# Patient Record
Sex: Female | Born: 1955 | Hispanic: No | Smoking: Former smoker
Health system: Southern US, Community
[De-identification: ages and names within clinical notes are randomized; demographics above are authoritative.]

## PROBLEM LIST (undated history)

## (undated) DIAGNOSIS — M810 Age-related osteoporosis without current pathological fracture: Secondary | ICD-10-CM

## (undated) DIAGNOSIS — R079 Chest pain, unspecified: Secondary | ICD-10-CM

## (undated) DIAGNOSIS — E538 Deficiency of other specified B group vitamins: Secondary | ICD-10-CM

## (undated) DIAGNOSIS — F411 Generalized anxiety disorder: Secondary | ICD-10-CM

## (undated) DIAGNOSIS — J309 Allergic rhinitis, unspecified: Secondary | ICD-10-CM

## (undated) DIAGNOSIS — J449 Chronic obstructive pulmonary disease, unspecified: Secondary | ICD-10-CM

## (undated) DIAGNOSIS — I498 Other specified cardiac arrhythmias: Secondary | ICD-10-CM

## (undated) DIAGNOSIS — D649 Anemia, unspecified: Secondary | ICD-10-CM

## (undated) DIAGNOSIS — R5381 Other malaise: Secondary | ICD-10-CM

## (undated) DIAGNOSIS — R5383 Other fatigue: Secondary | ICD-10-CM

## (undated) DIAGNOSIS — G8190 Hemiplegia, unspecified affecting unspecified side: Secondary | ICD-10-CM

## (undated) DIAGNOSIS — M949 Disorder of cartilage, unspecified: Secondary | ICD-10-CM

## (undated) DIAGNOSIS — M899 Disorder of bone, unspecified: Secondary | ICD-10-CM

## (undated) DIAGNOSIS — Z8679 Personal history of other diseases of the circulatory system: Secondary | ICD-10-CM

## (undated) DIAGNOSIS — G4700 Insomnia, unspecified: Secondary | ICD-10-CM

## (undated) DIAGNOSIS — I10 Essential (primary) hypertension: Secondary | ICD-10-CM

## (undated) DIAGNOSIS — E785 Hyperlipidemia, unspecified: Secondary | ICD-10-CM

## (undated) DIAGNOSIS — I080 Rheumatic disorders of both mitral and aortic valves: Secondary | ICD-10-CM

## (undated) DIAGNOSIS — M549 Dorsalgia, unspecified: Secondary | ICD-10-CM

## (undated) DIAGNOSIS — F329 Major depressive disorder, single episode, unspecified: Secondary | ICD-10-CM

## (undated) DIAGNOSIS — I739 Peripheral vascular disease, unspecified: Secondary | ICD-10-CM

## (undated) DIAGNOSIS — E559 Vitamin D deficiency, unspecified: Secondary | ICD-10-CM

## (undated) DIAGNOSIS — H547 Unspecified visual loss: Secondary | ICD-10-CM

## (undated) DIAGNOSIS — R51 Headache: Secondary | ICD-10-CM

## (undated) DIAGNOSIS — K219 Gastro-esophageal reflux disease without esophagitis: Secondary | ICD-10-CM

## (undated) HISTORY — DX: Disorder of bone, unspecified: M89.9

## (undated) HISTORY — DX: Peripheral vascular disease, unspecified: I73.9

## (undated) HISTORY — DX: Allergic rhinitis, unspecified: J30.9

## (undated) HISTORY — PX: CHOLECYSTECTOMY: SHX55

## (undated) HISTORY — DX: Other specified cardiac arrhythmias: I49.8

## (undated) HISTORY — DX: Dorsalgia, unspecified: M54.9

## (undated) HISTORY — DX: Chronic obstructive pulmonary disease, unspecified: J44.9

## (undated) HISTORY — DX: Other malaise: R53.81

## (undated) HISTORY — DX: Headache: R51

## (undated) HISTORY — DX: Disorder of cartilage, unspecified: M94.9

## (undated) HISTORY — DX: Age-related osteoporosis without current pathological fracture: M81.0

## (undated) HISTORY — DX: Generalized anxiety disorder: F41.1

## (undated) HISTORY — DX: Deficiency of other specified B group vitamins: E53.8

## (undated) HISTORY — DX: Chest pain, unspecified: R07.9

## (undated) HISTORY — DX: Unspecified visual loss: H54.7

## (undated) HISTORY — DX: Major depressive disorder, single episode, unspecified: F32.9

## (undated) HISTORY — DX: Essential (primary) hypertension: I10

## (undated) HISTORY — DX: Vitamin D deficiency, unspecified: E55.9

## (undated) HISTORY — DX: Gastro-esophageal reflux disease without esophagitis: K21.9

## (undated) HISTORY — DX: Personal history of other diseases of the circulatory system: Z86.79

## (undated) HISTORY — DX: Other fatigue: R53.83

## (undated) HISTORY — DX: Insomnia, unspecified: G47.00

## (undated) HISTORY — DX: Rheumatic disorders of both mitral and aortic valves: I08.0

## (undated) HISTORY — PX: TUBAL LIGATION: SHX77

## (undated) HISTORY — DX: Anemia, unspecified: D64.9

## (undated) HISTORY — DX: Hyperlipidemia, unspecified: E78.5

## (undated) HISTORY — DX: Hemiplegia, unspecified affecting unspecified side: G81.90

---

## 2002-01-26 ENCOUNTER — Inpatient Hospital Stay (HOSPITAL_COMMUNITY): Admission: EM | Admit: 2002-01-26 | Discharge: 2002-02-01 | Payer: Self-pay | Admitting: Emergency Medicine

## 2002-01-26 ENCOUNTER — Encounter: Payer: Self-pay | Admitting: Emergency Medicine

## 2002-01-27 ENCOUNTER — Encounter: Payer: Self-pay | Admitting: General Surgery

## 2002-01-29 ENCOUNTER — Encounter: Payer: Self-pay | Admitting: General Surgery

## 2002-01-31 ENCOUNTER — Encounter: Payer: Self-pay | Admitting: General Surgery

## 2002-03-01 ENCOUNTER — Ambulatory Visit (HOSPITAL_COMMUNITY): Admission: RE | Admit: 2002-03-01 | Discharge: 2002-03-01 | Payer: Self-pay | Admitting: Cardiology

## 2003-05-01 ENCOUNTER — Inpatient Hospital Stay (HOSPITAL_COMMUNITY): Admission: EM | Admit: 2003-05-01 | Discharge: 2003-05-07 | Payer: Self-pay | Admitting: Emergency Medicine

## 2003-05-01 ENCOUNTER — Encounter: Payer: Self-pay | Admitting: Emergency Medicine

## 2003-05-01 ENCOUNTER — Encounter: Payer: Self-pay | Admitting: Neurology

## 2003-05-01 ENCOUNTER — Encounter: Payer: Self-pay | Admitting: Cardiology

## 2003-05-07 ENCOUNTER — Inpatient Hospital Stay (HOSPITAL_COMMUNITY)
Admission: RE | Admit: 2003-05-07 | Discharge: 2003-05-22 | Payer: Self-pay | Admitting: Physical Medicine & Rehabilitation

## 2003-06-30 ENCOUNTER — Encounter
Admission: RE | Admit: 2003-06-30 | Discharge: 2003-09-28 | Payer: Self-pay | Admitting: Physical Medicine & Rehabilitation

## 2004-03-09 ENCOUNTER — Encounter
Admission: RE | Admit: 2004-03-09 | Discharge: 2004-06-07 | Payer: Self-pay | Admitting: Physical Medicine & Rehabilitation

## 2004-07-18 LAB — HM COLONOSCOPY: HM Colonoscopy: NORMAL

## 2004-08-26 ENCOUNTER — Ambulatory Visit: Payer: Self-pay | Admitting: Internal Medicine

## 2005-08-05 ENCOUNTER — Ambulatory Visit: Payer: Self-pay | Admitting: Internal Medicine

## 2005-09-15 ENCOUNTER — Ambulatory Visit: Payer: Self-pay | Admitting: Internal Medicine

## 2006-06-29 ENCOUNTER — Ambulatory Visit: Payer: Self-pay | Admitting: Internal Medicine

## 2006-08-15 ENCOUNTER — Ambulatory Visit: Payer: Self-pay | Admitting: Internal Medicine

## 2006-10-12 ENCOUNTER — Ambulatory Visit: Payer: Self-pay | Admitting: Internal Medicine

## 2006-10-12 LAB — CONVERTED CEMR LAB: Calcium, Total (PTH): 9.4 mg/dL (ref 8.4–10.5)

## 2007-08-03 ENCOUNTER — Telehealth: Payer: Self-pay | Admitting: Internal Medicine

## 2007-09-28 ENCOUNTER — Telehealth: Payer: Self-pay | Admitting: Internal Medicine

## 2007-10-16 ENCOUNTER — Telehealth: Payer: Self-pay | Admitting: Internal Medicine

## 2007-10-25 ENCOUNTER — Ambulatory Visit: Payer: Self-pay | Admitting: Internal Medicine

## 2007-10-25 DIAGNOSIS — R519 Headache, unspecified: Secondary | ICD-10-CM | POA: Insufficient documentation

## 2007-10-25 DIAGNOSIS — I739 Peripheral vascular disease, unspecified: Secondary | ICD-10-CM

## 2007-10-25 DIAGNOSIS — J4489 Other specified chronic obstructive pulmonary disease: Secondary | ICD-10-CM

## 2007-10-25 DIAGNOSIS — Z8679 Personal history of other diseases of the circulatory system: Secondary | ICD-10-CM

## 2007-10-25 DIAGNOSIS — F411 Generalized anxiety disorder: Secondary | ICD-10-CM

## 2007-10-25 DIAGNOSIS — J449 Chronic obstructive pulmonary disease, unspecified: Secondary | ICD-10-CM

## 2007-10-25 DIAGNOSIS — R5381 Other malaise: Secondary | ICD-10-CM

## 2007-10-25 DIAGNOSIS — K219 Gastro-esophageal reflux disease without esophagitis: Secondary | ICD-10-CM

## 2007-10-25 DIAGNOSIS — Z8673 Personal history of transient ischemic attack (TIA), and cerebral infarction without residual deficits: Secondary | ICD-10-CM | POA: Insufficient documentation

## 2007-10-25 DIAGNOSIS — I10 Essential (primary) hypertension: Secondary | ICD-10-CM

## 2007-10-25 DIAGNOSIS — G4700 Insomnia, unspecified: Secondary | ICD-10-CM | POA: Insufficient documentation

## 2007-10-25 DIAGNOSIS — F3289 Other specified depressive episodes: Secondary | ICD-10-CM

## 2007-10-25 DIAGNOSIS — J309 Allergic rhinitis, unspecified: Secondary | ICD-10-CM

## 2007-10-25 DIAGNOSIS — R51 Headache: Secondary | ICD-10-CM

## 2007-10-25 DIAGNOSIS — E785 Hyperlipidemia, unspecified: Secondary | ICD-10-CM | POA: Insufficient documentation

## 2007-10-25 DIAGNOSIS — M810 Age-related osteoporosis without current pathological fracture: Secondary | ICD-10-CM

## 2007-10-25 DIAGNOSIS — M549 Dorsalgia, unspecified: Secondary | ICD-10-CM

## 2007-10-25 DIAGNOSIS — R5383 Other fatigue: Secondary | ICD-10-CM

## 2007-10-25 DIAGNOSIS — F329 Major depressive disorder, single episode, unspecified: Secondary | ICD-10-CM

## 2007-10-25 DIAGNOSIS — I080 Rheumatic disorders of both mitral and aortic valves: Secondary | ICD-10-CM

## 2007-10-25 HISTORY — DX: Essential (primary) hypertension: I10

## 2007-10-25 HISTORY — DX: Gastro-esophageal reflux disease without esophagitis: K21.9

## 2007-10-25 HISTORY — DX: Chronic obstructive pulmonary disease, unspecified: J44.9

## 2007-10-25 HISTORY — DX: Hyperlipidemia, unspecified: E78.5

## 2007-10-25 HISTORY — DX: Generalized anxiety disorder: F41.1

## 2007-10-25 HISTORY — DX: Age-related osteoporosis without current pathological fracture: M81.0

## 2007-10-25 HISTORY — DX: Peripheral vascular disease, unspecified: I73.9

## 2007-10-25 HISTORY — DX: Other malaise: R53.81

## 2007-10-25 HISTORY — DX: Rheumatic disorders of both mitral and aortic valves: I08.0

## 2007-10-25 HISTORY — DX: Other specified depressive episodes: F32.89

## 2007-10-25 HISTORY — DX: Personal history of other diseases of the circulatory system: Z86.79

## 2007-10-25 HISTORY — DX: Major depressive disorder, single episode, unspecified: F32.9

## 2007-10-25 HISTORY — DX: Dorsalgia, unspecified: M54.9

## 2007-10-25 HISTORY — DX: Other specified chronic obstructive pulmonary disease: J44.89

## 2007-10-25 HISTORY — DX: Allergic rhinitis, unspecified: J30.9

## 2007-10-25 HISTORY — DX: Headache: R51

## 2007-10-25 HISTORY — DX: Insomnia, unspecified: G47.00

## 2007-10-25 LAB — CONVERTED CEMR LAB
ALT: 13 units/L (ref 0–35)
Basophils Absolute: 0 10*3/uL (ref 0.0–0.1)
Basophils Relative: 0.2 % (ref 0.0–1.0)
CO2: 27 meq/L (ref 19–32)
Calcium: 8.9 mg/dL (ref 8.4–10.5)
Cholesterol: 148 mg/dL (ref 0–200)
Creatinine, Ser: 1 mg/dL (ref 0.4–1.2)
Eosinophils Absolute: 0.1 10*3/uL (ref 0.0–0.7)
GFR calc Af Amer: 74 mL/min
GFR calc non Af Amer: 61 mL/min
HCT: 39.5 % (ref 36.0–46.0)
Hemoglobin: 12.6 g/dL (ref 12.0–15.0)
MCHC: 32 g/dL (ref 30.0–36.0)
MCV: 87.2 fL (ref 78.0–100.0)
Monocytes Absolute: 0.4 10*3/uL (ref 0.1–1.0)
Neutro Abs: 2.5 10*3/uL (ref 1.4–7.7)
RBC: 4.53 M/uL (ref 3.87–5.11)
RDW: 13.7 % (ref 11.5–14.6)
Sodium: 146 meq/L — ABNORMAL HIGH (ref 135–145)
TSH: 0.57 microintl units/mL (ref 0.35–5.50)
Total Bilirubin: 0.5 mg/dL (ref 0.3–1.2)
Triglycerides: 105 mg/dL (ref 0–149)

## 2007-12-19 ENCOUNTER — Inpatient Hospital Stay (HOSPITAL_COMMUNITY): Admission: EM | Admit: 2007-12-19 | Discharge: 2007-12-21 | Payer: Self-pay | Admitting: Emergency Medicine

## 2007-12-19 ENCOUNTER — Ambulatory Visit: Payer: Self-pay | Admitting: Internal Medicine

## 2007-12-19 ENCOUNTER — Telehealth: Payer: Self-pay | Admitting: Internal Medicine

## 2007-12-20 ENCOUNTER — Encounter: Payer: Self-pay | Admitting: Internal Medicine

## 2007-12-20 ENCOUNTER — Ambulatory Visit: Payer: Self-pay | Admitting: Internal Medicine

## 2007-12-31 ENCOUNTER — Ambulatory Visit: Payer: Self-pay | Admitting: Internal Medicine

## 2007-12-31 DIAGNOSIS — R079 Chest pain, unspecified: Secondary | ICD-10-CM | POA: Insufficient documentation

## 2007-12-31 HISTORY — DX: Chest pain, unspecified: R07.9

## 2008-01-01 ENCOUNTER — Encounter (INDEPENDENT_AMBULATORY_CARE_PROVIDER_SITE_OTHER): Payer: Self-pay | Admitting: *Deleted

## 2008-01-10 ENCOUNTER — Ambulatory Visit: Payer: Self-pay

## 2008-01-10 ENCOUNTER — Encounter: Payer: Self-pay | Admitting: Internal Medicine

## 2008-01-31 ENCOUNTER — Telehealth (INDEPENDENT_AMBULATORY_CARE_PROVIDER_SITE_OTHER): Payer: Self-pay | Admitting: *Deleted

## 2008-02-05 ENCOUNTER — Ambulatory Visit: Payer: Self-pay | Admitting: Internal Medicine

## 2008-02-05 ENCOUNTER — Encounter: Payer: Self-pay | Admitting: Internal Medicine

## 2008-07-07 ENCOUNTER — Telehealth (INDEPENDENT_AMBULATORY_CARE_PROVIDER_SITE_OTHER): Payer: Self-pay | Admitting: *Deleted

## 2008-10-02 ENCOUNTER — Telehealth (INDEPENDENT_AMBULATORY_CARE_PROVIDER_SITE_OTHER): Payer: Self-pay | Admitting: *Deleted

## 2008-10-24 ENCOUNTER — Telehealth (INDEPENDENT_AMBULATORY_CARE_PROVIDER_SITE_OTHER): Payer: Self-pay | Admitting: *Deleted

## 2008-10-29 ENCOUNTER — Encounter: Payer: Self-pay | Admitting: Internal Medicine

## 2008-11-05 ENCOUNTER — Telehealth (INDEPENDENT_AMBULATORY_CARE_PROVIDER_SITE_OTHER): Payer: Self-pay | Admitting: *Deleted

## 2008-11-10 ENCOUNTER — Ambulatory Visit: Payer: Self-pay | Admitting: Internal Medicine

## 2008-11-19 LAB — CONVERTED CEMR LAB
AST: 25 units/L (ref 0–37)
Alkaline Phosphatase: 69 units/L (ref 39–117)
Bilirubin, Direct: 0.3 mg/dL (ref 0.0–0.3)
CO2: 29 meq/L (ref 19–32)
Calcium: 9.1 mg/dL (ref 8.4–10.5)
HDL: 56.8 mg/dL (ref >39.00)
Potassium: 4.5 meq/L (ref 3.5–5.1)
Sodium: 140 meq/L (ref 135–145)
Total CHOL/HDL Ratio: 3
Total Protein: 6.9 g/dL (ref 6.0–8.3)
VLDL: 22 mg/dL (ref 0.0–40.0)

## 2008-12-02 ENCOUNTER — Ambulatory Visit: Payer: Self-pay | Admitting: Internal Medicine

## 2008-12-31 ENCOUNTER — Telehealth (INDEPENDENT_AMBULATORY_CARE_PROVIDER_SITE_OTHER): Payer: Self-pay | Admitting: *Deleted

## 2009-02-12 ENCOUNTER — Encounter: Payer: Self-pay | Admitting: Internal Medicine

## 2009-02-13 ENCOUNTER — Telehealth (INDEPENDENT_AMBULATORY_CARE_PROVIDER_SITE_OTHER): Payer: Self-pay | Admitting: *Deleted

## 2009-10-06 ENCOUNTER — Telehealth: Payer: Self-pay | Admitting: Internal Medicine

## 2009-10-16 ENCOUNTER — Telehealth: Payer: Self-pay | Admitting: Internal Medicine

## 2009-10-29 ENCOUNTER — Telehealth: Payer: Self-pay | Admitting: Internal Medicine

## 2009-10-29 ENCOUNTER — Encounter (INDEPENDENT_AMBULATORY_CARE_PROVIDER_SITE_OTHER): Payer: Self-pay | Admitting: *Deleted

## 2009-11-03 ENCOUNTER — Telehealth: Payer: Self-pay | Admitting: Internal Medicine

## 2009-11-19 ENCOUNTER — Ambulatory Visit: Payer: Self-pay | Admitting: Internal Medicine

## 2009-11-19 DIAGNOSIS — M899 Disorder of bone, unspecified: Secondary | ICD-10-CM | POA: Insufficient documentation

## 2009-11-19 DIAGNOSIS — I498 Other specified cardiac arrhythmias: Secondary | ICD-10-CM

## 2009-11-19 DIAGNOSIS — M949 Disorder of cartilage, unspecified: Secondary | ICD-10-CM

## 2009-11-19 HISTORY — DX: Other specified cardiac arrhythmias: I49.8

## 2009-11-19 HISTORY — DX: Disorder of bone, unspecified: M89.9

## 2009-11-19 LAB — CONVERTED CEMR LAB: Vit D, 25-Hydroxy: 26 ng/mL — ABNORMAL LOW (ref 30–89)

## 2009-11-23 ENCOUNTER — Encounter: Payer: Self-pay | Admitting: Internal Medicine

## 2009-11-23 DIAGNOSIS — G8190 Hemiplegia, unspecified affecting unspecified side: Secondary | ICD-10-CM

## 2009-11-23 DIAGNOSIS — I69359 Hemiplegia and hemiparesis following cerebral infarction affecting unspecified side: Secondary | ICD-10-CM | POA: Insufficient documentation

## 2009-11-23 DIAGNOSIS — H547 Unspecified visual loss: Secondary | ICD-10-CM

## 2009-11-23 HISTORY — DX: Unspecified visual loss: H54.7

## 2009-11-23 HISTORY — DX: Hemiplegia, unspecified affecting unspecified side: G81.90

## 2009-11-23 LAB — CONVERTED CEMR LAB
ALT: 10 units/L (ref 0–35)
Albumin: 3.9 g/dL (ref 3.5–5.2)
Alkaline Phosphatase: 102 units/L (ref 39–117)
BUN: 11 mg/dL (ref 6–23)
Basophils Relative: 0.8 % (ref 0.0–3.0)
Bilirubin, Direct: 0 mg/dL (ref 0.0–0.3)
Eosinophils Relative: 1.6 % (ref 0.0–5.0)
Folate: 3.5 ng/mL
GFR calc non Af Amer: 68.92 mL/min (ref >60)
HDL: 63.9 mg/dL (ref >39.00)
Iron: 39 ug/dL — ABNORMAL LOW (ref 42–145)
Lymphocytes Relative: 25 % (ref 12.0–46.0)
Monocytes Relative: 6.9 % (ref 3.0–12.0)
Neutrophils Relative %: 65.7 % (ref 43.0–77.0)
RBC: 4.96 M/uL (ref 3.87–5.11)
Triglycerides: 186 mg/dL — ABNORMAL HIGH (ref 0.0–149.0)
Vitamin B-12: 166 pg/mL — ABNORMAL LOW (ref 211–911)
WBC: 5.3 10*3/uL (ref 4.5–10.5)

## 2009-12-01 ENCOUNTER — Ambulatory Visit: Payer: Self-pay | Admitting: Internal Medicine

## 2009-12-01 DIAGNOSIS — E538 Deficiency of other specified B group vitamins: Secondary | ICD-10-CM

## 2009-12-01 HISTORY — DX: Deficiency of other specified B group vitamins: E53.8

## 2010-01-07 ENCOUNTER — Ambulatory Visit: Payer: Self-pay | Admitting: Internal Medicine

## 2010-06-03 ENCOUNTER — Ambulatory Visit: Payer: Self-pay | Admitting: Internal Medicine

## 2010-06-03 DIAGNOSIS — E559 Vitamin D deficiency, unspecified: Secondary | ICD-10-CM

## 2010-06-03 HISTORY — DX: Vitamin D deficiency, unspecified: E55.9

## 2010-06-14 ENCOUNTER — Telehealth: Payer: Self-pay | Admitting: Internal Medicine

## 2010-06-16 ENCOUNTER — Telehealth: Payer: Self-pay | Admitting: Internal Medicine

## 2010-07-07 ENCOUNTER — Encounter: Payer: Self-pay | Admitting: Internal Medicine

## 2010-08-17 NOTE — Progress Notes (Signed)
  Phone Note Outgoing Call   Call placed by: Robin Call placed to: Patient Summary of Call: Called pt. to inform medication (Plavix) ordered from patient assitance has arrived at our office and ready for pickup. Left msg. for pt. to call back. Initial call taken by: Robin Ewing CMA Duncan Dull),  June 14, 2010 3:27 PM  Follow-up for Phone Call        called pt informed of above information. Follow-up by: Zella Ball Ewing CMA Duncan Dull),  June 14, 2010 4:17 PM

## 2010-08-17 NOTE — Progress Notes (Signed)
Summary: Plavix?  Phone Note Call from Patient Call back at [facility] Phone [phone]   Caller: Patient Summary of Call: pt called stating that she picked up medications from Pt Asst program, but it did not include Plavix. Pt is requesting to know the status of order, please advise. I called Pt Asst and spoke with Rep Malachi Bonds, who advised that Plavix was signed for at our office 03/30 @ 11:12a. I was not able to locate Rx but I faxed letter to company [phone] as advised by Rep requesting a replacement. I also told Pt that she would need to re-apply by June via VM.  Initial call taken by: Margaret Pyle, CMA,  October 29, 2009 11:08 AM

## 2010-08-17 NOTE — Assessment & Plan Note (Signed)
Summary: b12 shot/[place]/ok per robin/cd  Nurse Visit   Allergies: 1)  ! Ace Inhibitors  Medication Administration  Injection # 1:    Medication: Vit B12 1000 mcg    Diagnosis: B12 DEFICIENCY (ICD-266.2)    Route: IM    Site: LUOQ gluteus    Exp Date: 07/19/2011    Lot #: [id number]    Mfr: [employer]    Patient tolerated injection without complications    Given by: Margaret Pyle, CMA (Dec 01, 2009 10:32 AM)  Orders Added: 1)  Vit B12 1000 mcg [J3420] 2)  Admin of Therapeutic Inj  intramuscular or subcutaneous [60454]

## 2010-08-17 NOTE — Miscellaneous (Signed)
Summary: Orders Update  Clinical Lists Changes  Problems: Added new problem of HEMIPARESIS, LEFT (ICD-342.90) Added new problem of UNSPECIFIED VISUAL LOSS (ICD-369.9)

## 2010-08-17 NOTE — Assessment & Plan Note (Signed)
Summary: 6 mos f/u // #/ cd   Vital Signs:  Patient profile:   55 year old female Height:      67 inches Weight:      140 pounds BMI:     22.01 O2 Sat:      97 % on Room air Temp:     97.8 degrees F oral Pulse rate:   63 / minute BP sitting:   110 / 84  (left arm) Cuff size:   regular  Vitals Entered By: Zella Ball Ewing CMA Duncan Dull) (June 03, 2010 9:39 AM)  O2 Flow:  Room air CC: 6 month ROV/RE   CC:  6 month ROV/RE.  History of Present Illness: here to f/u; due for b12 today, and labs reviewed with low vit d;  decided not to take the citalopram and states on her own she has improve - Denies worsening depressive symptoms, suicidal ideation, or panic, or increased anxiety.  Needs med refills.  Incidently today with 3 days mild to mod ST, fever, general weakness and malaise, with prod cough greenish sputum  but Pt denies CP, worsening sob, doe, wheezing, orthopnea, pnd, worsening LE edema, palps, dizziness or syncope  Pt denies new neuro symptoms such as headache, facial or extremity weakness Pt denies polydipsia, polyuria    Overall good compliance with meds, trying to follow low chol diet, wt stable, little excercise however   Problems Prior to Update: 1)  Bronchitis-acute  (ICD-466.0) 2)  Vitamin D Deficiency  (ICD-268.9) 3)  B12 Deficiency  (ICD-266.2) 4)  Unspecified Visual Loss  (ICD-369.9) 5)  Hemiparesis, Left  (ICD-342.90) 6)  Preventive Health Care  (ICD-V70.0) 7)  Bradycardia  (ICD-427.89) 8)  Osteopenia  (ICD-733.90) 9)  Chest Pain  (ICD-786.50) 10)  Anxiety  (ICD-300.00) 11)  Depression  (ICD-311) 12)  Gerd  (ICD-530.81) 13)  Osteoporosis  (ICD-733.00) 14)  Fatigue  (ICD-780.79) 15)  Insomnia-sleep Disorder-unspec  (ICD-780.52) 16)  Back Pain  (ICD-724.5) 17)  Allergic Rhinitis  (ICD-477.9) 18)  Mitral Insufficiency  (ICD-396.3) 19)  Cerebrovascular Accident, Hx of  (ICD-V12.50) 20)  Peripheral Vascular Disease  (ICD-443.9) 21)  Hypertension  (ICD-401.9) 22)   Hyperlipidemia  (ICD-272.4) 23)  Headache  (ICD-784.0) 24)  COPD  (ICD-496)  Medications Prior to Update: 1)  Plavix 75 Mg  Tabs (Clopidogrel Bisulfate) .... Take 1 Tablet By Mouth Once A Day 2)  Norvasc 10 Mg  Tabs (Amlodipine Besylate) .... Take 1 Tablet By Mouth Once A Day 3)  Aldactazide 25-25 Mg  Tabs (Spironolactone-Hctz) .Marland Kitchen.. 1 By Mouth Once Daily 4)  Metoprolol Tartrate 25 Mg  Tabs (Metoprolol Tartrate) .Marland Kitchen.. 1 By Mouth Once Daily 5)  Fosamax 70 Mg  Tabs (Alendronate Sodium) .Marland Kitchen.. 1 By Mouth  Wkly 6)  Clonidine Hcl 0.3 Mg  Tabs (Clonidine Hcl) .Marland Kitchen.. 1 By Mouth Two Times A Day 7)  One A Day Multi Vitamin .Marland Kitchen.. 1 By Mouth Once Daily 8)  Citalopram Hydrobromide 10 Mg Tabs (Citalopram Hydrobromide) .Marland Kitchen.. 1po Once Daily 9)  Vitamin D 1000 Unit Tabs (Cholecalciferol) .Marland Kitchen.. 1 By Mouth Once Daily 10)  Cyanocobalamin 1000 Mcg/ml Soln (Cyanocobalamin) .Marland Kitchen.. 1 Cc Im Q Month 11)  Ferrous Sulfate 325 (65 Fe) Mg Tabs (Ferrous Sulfate) .Marland Kitchen.. 1po Once Daily For 3 Months  Current Medications (verified): 1)  Plavix 75 Mg  Tabs (Clopidogrel Bisulfate) .... Take 1 Tablet By Mouth Once A Day 2)  Norvasc 10 Mg  Tabs (Amlodipine Besylate) .... Take 1 Tablet By Mouth Once A Day 3)  Aldactazide 25-25 Mg  Tabs (Spironolactone-Hctz) .Marland Kitchen.. 1 By Mouth Once Daily 4)  Metoprolol Tartrate 25 Mg  Tabs (Metoprolol Tartrate) .Marland Kitchen.. 1 By Mouth Once Daily 5)  Fosamax 70 Mg  Tabs (Alendronate Sodium) .Marland Kitchen.. 1 By Mouth  Wkly 6)  Clonidine Hcl 0.3 Mg  Tabs (Clonidine Hcl) .Marland Kitchen.. 1 By Mouth Two Times A Day 7)  One A Day Multi Vitamin .Marland Kitchen.. 1 By Mouth Once Daily 8)  Vitamin D 1000 Unit Tabs (Cholecalciferol) .Marland Kitchen.. 1 By Mouth Once Daily 9)  Cyanocobalamin 1000 Mcg/ml Soln (Cyanocobalamin) .Marland Kitchen.. 1 Cc Im Q Month 10)  Ferrous Sulfate 325 (65 Fe) Mg Tabs (Ferrous Sulfate) .Marland Kitchen.. 1po Once Daily For 3 Months 11)  Azithromycin 250 Mg Tabs (Azithromycin) .... 2po Qd For 1 Day, Then 1po Qd For 4days, Then Stop  Allergies (verified): 1)  ! 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 Cholecystectomy Tubal ligation  Review of Systems       all otherwise negative per pt -    Physical Exam  General:  alert and well-developed.   Head:  normocephalic and atraumatic.   Eyes:  vision grossly intact, pupils equal, and pupils round.   Ears:  bilat tm's red , sinus nontender bilat Nose:  nasal dischargemucosal pallor and mucosal edema.   Mouth:  pharyngeal erythema and fair dentition.   Neck:  supple and cervical lymphadenopathy.   Lungs:  normal respiratory effort and normal breath sounds.   Heart:  normal rate and regular rhythm.   Extremities:  no edema, no erythema  Psych:  not anxious appearing and not depressed appearing.     Impression & Recommendations:  Problem # 1:  BRONCHITIS-ACUTE (ICD-466.0)  Her updated medication list for this problem includes:    Azithromycin 250 Mg Tabs (Azithromycin) .Marland Kitchen... 2po qd for 1 day, then 1po qd for 4days, then stop treat as above, f/u any worsening signs or symptoms   Problem # 2:  VITAMIN D DEFICIENCY (ICD-268.9) for vit d supplementation  Problem # 3:  B12 DEFICIENCY (ICD-266.2) due for B12  - for IM inj today Orders: Vit B12 1000 mcg (J3420) Admin of Therapeutic Inj  intramuscular or subcutaneous (78295)  Problem # 4:  DEPRESSION (ICD-311)  The following medications were removed  from the medication list:    Citalopram Hydrobromide 10 Mg Tabs (Citalopram hydrobromide) .Marland Kitchen... 1po once daily improved per pt without med  - stable overall by hx and exam, ok to continue off med for now  Problem # 5:  HYPERTENSION (ICD-401.9)  Her updated medication list for this problem includes:    Norvasc 10 Mg Tabs (Amlodipine besylate) .Marland Kitchen... Take 1 tablet by mouth once a day    Aldactazide 25-25 Mg Tabs (Spironolactone-hctz) .Marland Kitchen... 1 by mouth once daily    Metoprolol Tartrate 25 Mg Tabs (Metoprolol tartrate) .Marland Kitchen... 1 by mouth once daily    Clonidine Hcl 0.3 Mg Tabs (Clonidine hcl) .Marland Kitchen... 1 by mouth two times a day  BP today: 110/84 Prior BP: 110/70 (11/19/2009)  Labs  Reviewed: K+: 3.8 (11/19/2009) Creat: : 1.1 (11/19/2009)   Chol: 190 (11/19/2009)   HDL: 63.90 (11/19/2009)   LDL: 89 (11/19/2009)   TG: 186.0 (11/19/2009) stable overall by hx and exam, ok to continue meds/tx as is   Complete Medication List: 1)  Plavix 75 Mg Tabs (Clopidogrel bisulfate) .... Take 1 tablet by mouth once a day 2)  Norvasc 10 Mg Tabs (Amlodipine besylate) .... Take 1 tablet by mouth once a day 3)  Aldactazide 25-25 Mg Tabs (Spironolactone-hctz) .Marland Kitchen.. 1 by mouth once daily 4)  Metoprolol Tartrate 25 Mg Tabs (Metoprolol tartrate) .Marland Kitchen.. 1 by mouth once daily 5)  Fosamax 70 Mg Tabs (Alendronate sodium) .Marland Kitchen.. 1 by mouth  wkly 6)  Clonidine Hcl 0.3 Mg Tabs (Clonidine hcl) .Marland Kitchen.. 1 by mouth two times a day 7)  One A Day Multi Vitamin  .Marland Kitchen.. 1 by mouth once daily 8)  Vitamin D 1000 Unit Tabs (Cholecalciferol) .Marland Kitchen.. 1 by mouth once daily 9)  Cyanocobalamin 1000 Mcg/ml Soln (Cyanocobalamin) .Marland Kitchen.. 1 cc im q month 10)  Ferrous Sulfate 325 (65 Fe) Mg Tabs (Ferrous sulfate) .Marland Kitchen.. 1po once daily for 3 months 11)  Azithromycin 250 Mg Tabs (Azithromycin) .... 2po qd for 1 day, then 1po qd for 4days, then stop  Patient Instructions: 1)  Please take all new medications as prescribed  - the antibiotic 2)  Continue all previous  medications as before this visit 3)  you had the b12 shot today 4)  Please schedule a follow-up appointment in 6 months. Prescriptions: AZITHROMYCIN 250 MG TABS (AZITHROMYCIN) 2po qd for 1 day, then 1po qd for 4days, then stop  #6 x 1   Entered and Authorized by:   Corwin Levins MD   Signed by:   Corwin Levins MD on 06/03/2010   Method used:   Print then Give to Patient   RxID:   [id number] ALDACTAZIDE 25-25 MG  TABS (SPIRONOLACTONE-HCTZ) 1 by mouth once daily  #90 x 3   Entered and Authorized by:   Corwin Levins MD   Signed by:   Corwin Levins MD on 06/03/2010   Method used:   Print then Give to Patient   RxID:   [id number] NORVASC 10 MG  TABS (AMLODIPINE BESYLATE) Take 1 tablet by mouth once a day  #90 x 3   Entered and Authorized by:   Corwin Levins MD   Signed by:   Corwin Levins MD on 06/03/2010   Method used:   Print then Give to Patient   RxID:   [id number] PLAVIX 75 MG  TABS (CLOPIDOGREL BISULFATE) Take 1 tablet by mouth once a day  #90 x 3   Entered and Authorized by:   Corwin Levins MD   Signed by:   Corwin Levins MD on 06/03/2010   Method used:   Print then Give to Patient   RxID:   [id number]    Medication Administration  Injection # 1:    Medication: Vit B12 1000 mcg    Diagnosis: B12 DEFICIENCY (ICD-266.2)    Route: IM    Site: R deltoid    Exp Date: 02/2012    Lot #: [id number]    Mfr: [employer]    Patient tolerated injection without complications    Given by: Zella Ball Ewing CMA Duncan Dull) (June 03, 2010 9:41 AM)  Orders Added: 1)  Vit B12 1000 mcg [J3420] 2)  Admin of Therapeutic Inj  intramuscular or subcutaneous [[id number]] 3)  Est.  Patient Level IV [40981]

## 2010-08-17 NOTE — Assessment & Plan Note (Signed)
Summary: f/u appt/#/cd   Vital Signs:  Patient profile:   55 year old female Height:      139 inches Weight:      12 pounds BMI:     0.44 O2 Sat:      98 % on Room air Temp:     97.4 degrees F oral Pulse rate:   44 / minute BP sitting:   110 / 70  (left arm) Cuff size:   regular  Vitals Entered ByZella Ball Ewing (Nov 19, 2009 9:35 AM)  O2 Flow:  Room air  Preventive Care Screening  Bone Density:    Date:  11/24/2008    Next Due:  11/2010    Results:  abnormal std dev  Colonoscopy:    Date:  07/18/2004    Next Due:  07/2014    Results:  normal per GI in [place]   CC: followup/RE   CC:  followup/RE.  History of Present Illness: check her BP weekly at minimum;  has had several low BP's such as 83/42 which scare her; but also more often has been  higher in up t o 150/102, usually assoc with more stresss;  Pt denies CP, sob, doe, wheezing, orthopnea, pnd, worsening LE edema, palps, dizziness or syncope  Pt denies new neuro symptoms such as headache, facial or extremity weakness  Has ongoing stress, much of it financial, BP tends to go up with stress  which makes it all worse as she worries about another stroke.  has not been taking the nsaid or PPI and doing well.    Here for wellness Diet: Heart Healthy or DM if diabetic Physical Activities: Sedentary Depression/mood screen: Negative Hearing: Intact bilateral Visual Acuity: diminished de to stroke per pt, gets exam yearly usually but not this yr so far  ADL's: Capable , except limited due to sight and residual left sided weakness, walks with cane; worse part is going up stairs  - has to do one step at a time, hard to balance, suffered right ankle sprain jan 2011 with fall Fall Risk: mild, as above Home Safety: Good Cognitive Impairment:  Gen appearance, affect, speech, memory, attention & motor skills grossly intact End-of-Life Planning: Advance directive - Full code/I agree   Preventive Screening-Counseling &  Management      Drug Use:  no.    Problems Prior to Update: 1)  Bradycardia  (ICD-427.89) 2)  Osteopenia  (ICD-733.90) 3)  Chest Pain  (ICD-786.50) 4)  Anxiety  (ICD-300.00) 5)  Depression  (ICD-311) 6)  Gerd  (ICD-530.81) 7)  Osteoporosis  (ICD-733.00) 8)  Fatigue  (ICD-780.79) 9)  Insomnia-sleep Disorder-unspec  (ICD-780.52) 10)  Back Pain  (ICD-724.5) 11)  Allergic Rhinitis  (ICD-477.9) 12)  Mitral Insufficiency  (ICD-396.3) 13)  Cerebrovascular Accident, Hx of  (ICD-V12.50) 14)  Peripheral Vascular Disease  (ICD-443.9) 15)  Hypertension  (ICD-401.9) 16)  Hyperlipidemia  (ICD-272.4) 17)  Headache  (ICD-784.0) 18)  COPD  (ICD-496)  Medications Prior to Update: 1)  Plavix 75 Mg  Tabs (Clopidogrel Bisulfate) .... Take 1 Tablet By Mouth Once A Day 2)  Norvasc 10 Mg  Tabs (Amlodipine Besylate) .... Take 1 Tablet By Mouth Once A Day 3)  Aldactazide 25-25 Mg  Tabs (Spironolactone-Hctz) .Marland Kitchen.. 1 By Mouth Once Daily 4)  Metoprolol Tartrate 25 Mg  Tabs (Metoprolol Tartrate) .Marland Kitchen.. 1 By Mouth Once Daily 5)  Fosamax 70 Mg  Tabs (Alendronate Sodium) .Marland Kitchen.. 1 By Mouth  Wkly 6)  Clonidine Hcl 0.3 Mg  Tabs (Clonidine Hcl) .Marland Kitchen.. 1 By Mouth Two Times A Day 7)  Diclofenac Sodium 75 Mg  Tbec (Diclofenac Sodium) .Marland Kitchen.. 1 By Mouth Two Times A Day As Needed 8)  Pantoprazole Sodium 40 Mg  Tbec (Pantoprazole Sodium) .Marland Kitchen.. 1 By Mouth Once Daily 9)  One A Day Multi Vitamin .Marland Kitchen.. 1 By Mouth Once Daily  Current Medications (verified): 1)  Plavix 75 Mg  Tabs (Clopidogrel Bisulfate) .... Take 1 Tablet By Mouth Once A Day 2)  Norvasc 10 Mg  Tabs (Amlodipine Besylate) .... Take 1 Tablet By Mouth Once A Day 3)  Aldactazide 25-25 Mg  Tabs (Spironolactone-Hctz) .Marland Kitchen.. 1 By Mouth Once Daily 4)  Metoprolol Tartrate 25 Mg  Tabs (Metoprolol Tartrate) .Marland Kitchen.. 1 By Mouth Once Daily 5)  Fosamax 70 Mg  Tabs (Alendronate Sodium) .Marland Kitchen.. 1 By Mouth  Wkly 6)  Clonidine Hcl 0.3 Mg  Tabs (Clonidine Hcl) .Marland Kitchen.. 1 By Mouth Two Times A Day 7)   One A Day Multi Vitamin .Marland Kitchen.. 1 By Mouth Once Daily 8)  Citalopram Hydrobromide 10 Mg Tabs (Citalopram Hydrobromide) .Marland Kitchen.. 1po Once Daily  Allergies (verified): 1)  ! Ace Inhibitors  Past History:  Past Surgical History: Last updated: 10/25/2007 Cholecystectomy Tubal ligation  Family History: Last updated: 10/25/2007 HTN heart disease arthritis stroke depression ETOH dependence  Social History: Last updated: 11/19/2009 Current Smoker former billing analyst - disabled since 2005 Married 3 children Alcohol use-yes Drug use-no  Risk Factors: Smoking Status: current (10/25/2007)  Past Medical History: COPD Headache Hyperlipidemia Hypertension Peripheral vascular disease Cerebrovascular accident, hx of - right thalamic Allergic rhinitis Osteoporosis GERD Depression Anxiety mild bilat RAS Osteopenia 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 HTN heart disease arthritis stroke depression ETOH dependence  Social History: Reviewed history from 11/10/2008 and no changes required. Current Smoker former Runner, broadcasting/film/video - disabled since 2005 Married 3 children Alcohol use-yes Drug use-no Drug Use:  no  Review of Systems  The patient denies anorexia, fever, vision loss, decreased hearing, hoarseness, chest pain, syncope, dyspnea on exertion, peripheral edema, prolonged cough, headaches, hemoptysis, abdominal pain, melena, hematochezia, severe indigestion/heartburn, hematuria, muscle weakness, suspicious skin lesions, transient blindness, difficulty walking, depression, unusual weight change, abnormal bleeding, enlarged lymph nodes, angioedema, and breast masses.         all otherwise negative per pt -  still with right shoulder tendonitis pain - tylenol helps,  also with ongoign fatigue no change - no OSA symtpoms  Physical Exam  General:  alert and well-developed.   Head:   normocephalic and atraumatic.   Eyes:  vision grossly intact, pupils equal, and pupils round.   Ears:  R ear normal and L ear normal.   Nose:  no external deformity and no nasal discharge.   Mouth:  no gingival abnormalities and pharynx pink and moist.   Neck:  supple and no masses.   Lungs:  normal respiratory effort and normal breath sounds.   Heart:  normal rate and regular rhythm.   Abdomen:  soft, non-tender, and normal bowel sounds.   Msk:  normal ROM, no joint tenderness, and no joint swelling.   Extremities:  no edema, no erythema  Neurologic:  alert & oriented X3, cranial nerves II-XII intact, strength normal in all extremities, sensation intact to light touch, and DTRs symmetrical and normal.  except for stable chronic 3/5 LUE and LLE diffuse weakness, and left patellar reflex hyperactive;  walks with cane ,  cannot put full wt on left leg to step but is able to manage to get up on exam table Skin:  color normal and no rashes.   Psych:  not anxious appearing and not depressed appearing.     Impression & Recommendations:  Problem # 1:  Preventive Health Care (ICD-V70.0)  Overall doing well, age appropriate education and counseling updated and referral for appropriate preventive services done unless declined, immunizations up to date or declined, diet counseling done if overweight, urged to quit smoking if smokes , most recent labs reviewed and current ordered if appropriate, ecg reviewed or declined (interpretation per ECG scanned in the EMR if done); information regarding Medicare Prevention requirements given if appropriate; speciality referrals updated as appropriate   Orders: First annual wellness visit with prevention plan  (Z6109)  Problem # 2:  FATIGUE (ICD-780.79)  mild, exam benign, to check labs below; follow with expectant management   Orders: TLB-BMP (Basic Metabolic Panel-BMET) (80048-METABOL) TLB-CBC Platelet - w/Differential (85025-CBCD) TLB-Hepatic/Liver  Function Pnl (80076-HEPATIC) TLB-TSH (Thyroid Stimulating Hormone) (84443-TSH) TLB-Sedimentation Rate (ESR) (85652-ESR) TLB-IBC Pnl (Iron/FE;Transferrin) (83550-IBC) TLB-B12 + Folate Pnl (82746_82607-B12/FOL) T-Vitamin D (25-Hydroxy) (60454-09811)  Problem # 3:  HYPERTENSION (ICD-401.9)  Her updated medication list for this problem includes:    Norvasc 10 Mg Tabs (Amlodipine besylate) .Marland Kitchen... Take 1 tablet by mouth once a day    Aldactazide 25-25 Mg Tabs (Spironolactone-hctz) .Marland Kitchen... 1 by mouth once daily    Metoprolol Tartrate 25 Mg Tabs (Metoprolol tartrate) .Marland Kitchen... 1 by mouth once daily    Clonidine Hcl 0.3 Mg Tabs (Clonidine hcl) .Marland Kitchen... 1 by mouth two times a day labile to a degree, Continue all previous medications as before this visit , cont to monitor at home  Problem # 4:  HYPERLIPIDEMIA (ICD-272.4)  Labs Reviewed: SGOT: 25 (11/10/2008)   SGPT: 16 (11/10/2008)   HDL:56.80 (11/10/2008), 45.0 (10/25/2007)  LDL:111 (11/10/2008), 82 (91/47/8295)  Chol:190 (11/10/2008), 148 (10/25/2007)  Trig:110.0 (11/10/2008), 105 (10/25/2007) Pt to continue diet efforts,; to check labs - goal LDL less than 70 ; has declined statin so far, but willing now to consider statin  Orders: TLB-Lipid Panel (80061-LIPID)  Problem # 5:  ANXIETY (ICD-300.00)  ok to start citalopram 10 mg per day  Her updated medication list for this problem includes:    Citalopram Hydrobromide 10 Mg Tabs (Citalopram hydrobromide) .Marland Kitchen... 1po once daily  Orders: Prescription Created Electronically [phone])  Problem # 6:  OSTEOPENIA (ICD-733.90)  Her updated medication list for this problem includes:    Fosamax 70 Mg Tabs (Alendronate sodium) .Marland Kitchen... 1 by mouth  wkly treat as above, f/u any worsening signs or symptoms   Problem # 7:  BRADYCARDIA (ICD-427.89)  Her updated medication list for this problem includes:    Plavix 75 Mg Tabs (Clopidogrel bisulfate) .Marland Kitchen... Take 1 tablet by mouth once a day    Metoprolol Tartrate 25  Mg Tabs (Metoprolol tartrate) .Marland Kitchen... 1 by mouth once daily asympt - ok to cont meds as is fo now  Problem # 8:  COPD (ICD-496) stable overall by hx and exam, ok to continue meds/tx as is but still smoking 0- urged to quit  Complete Medication List: 1)  Plavix 75 Mg Tabs (Clopidogrel bisulfate) .... Take 1 tablet by mouth once a day 2)  Norvasc 10 Mg Tabs (Amlodipine besylate) .... Take 1 tablet by mouth once a day 3)  Aldactazide 25-25 Mg Tabs (Spironolactone-hctz) .Marland Kitchen.. 1 by mouth once daily 4)  Metoprolol Tartrate 25 Mg Tabs (Metoprolol tartrate) .Marland KitchenMarland KitchenMarland Kitchen  1 by mouth once daily 5)  Fosamax 70 Mg Tabs (Alendronate sodium) .Marland Kitchen.. 1 by mouth  wkly 6)  Clonidine Hcl 0.3 Mg Tabs (Clonidine hcl) .Marland Kitchen.. 1 by mouth two times a day 7)  One A Day Multi Vitamin  .Marland Kitchen.. 1 by mouth once daily 8)  Citalopram Hydrobromide 10 Mg Tabs (Citalopram hydrobromide) .Marland Kitchen.. 1po once daily  Patient Instructions: 1)  Please go to the Lab in the basement for your blood and/or urine tests today  2)  Please see your opthomologist on a year basis 3)  please call for your yearly mammogram - consider [facility] 4)  please call for your yealry pap smear - considier [facility] 5)  Please take all new medications as prescribed  - the new citalopram for nerves 6)  Continue all previous medications as before this visit 7)  please stop smoking  8)  Please schedule a follow-up appointment in 6 months or sooner if needed Prescriptions: CITALOPRAM HYDROBROMIDE 10 MG TABS (CITALOPRAM HYDROBROMIDE) 1po once daily  #90 x 3   Entered and Authorized by:   Corwin Levins MD   Signed by:   Corwin Levins MD on 11/19/2009   Method used:   Electronically to        [facility]  [address] [address] [phone].* (retail)       [address]       [place], [place]  [address]       Ph: [phone] or [phone]       Fax: [phone]   RxID:   [id number] CLONIDINE HCL 0.3 MG  TABS (CLONIDINE HCL) 1 by mouth two times a day  #180 x 3    Entered and Authorized by:   Corwin Levins MD   Signed by:   Corwin Levins MD on 11/19/2009   Method used:   Electronically to        [facility]  [address] [phone].* (retail)       [address]       [place], [place]  [address]       Ph: [phone] or [phone]       Fax: [phone]   RxID:   [id number] FOSAMAX 70 MG  TABS (ALENDRONATE SODIUM) 1 by mouth  wkly  #12 x 3   Entered and Authorized by:   Corwin Levins MD   Signed by:   Corwin Levins MD on 11/19/2009   Method used:   Electronically to        [facility]  [address] [phone].* (retail)       [address]       [place], [place]  [address]       Ph: [phone] or [phone]       Fax: [phone]   RxID:   [id number] METOPROLOL TARTRATE 25 MG  TABS (METOPROLOL TARTRATE) 1 by mouth once daily  #90 x 3   Entered and Authorized by:   Corwin Levins MD   Signed by:   Corwin Levins MD on 11/19/2009   Method used:   Electronically to        [facility]  [address] [phone].* (retail)       [address]       [place], [place]  [address]       Ph: [phone] or [phone]       Fax: [phone]   RxID:   [id number] ALDACTAZIDE 25-25 MG  TABS (SPIRONOLACTONE-HCTZ) 1 by mouth once daily  #90 x 3   Entered and Authorized by:   Corwin Levins MD   Signed by:   Corwin Levins MD on 11/19/2009   Method used:   Electronically to        [facility]  [address] [phone].* (retail)       [address]       [place], [place]  [address]       Ph: [phone] or [phone]       Fax: [phone]   RxID:   [id number] NORVASC 10 MG  TABS (AMLODIPINE BESYLATE) Take 1 tablet by mouth once a day  #90 x 3   Entered and Authorized by:   Corwin Levins MD   Signed by:   Corwin Levins MD on 11/19/2009   Method used:   Electronically to        [facility]  [address] [phone].* (retail)       [address]       [place], [place]  [address]       Ph: [phone] or [phone]       Fax: [phone]    RxID:   [id number] PLAVIX 75 MG  TABS (CLOPIDOGREL BISULFATE) Take 1 tablet by mouth once a day  #90 x 3   Entered and Authorized by:   Corwin Levins MD   Signed by:   Corwin Levins MD on 11/19/2009   Method used:   Electronically to        [facility]  [address] [phone].* (retail)       [address]       [place], [place]  [address]       Ph: [phone] or [phone]       Fax: [phone]   RxID:   [id number]

## 2010-08-17 NOTE — Progress Notes (Signed)
Summary: RX refills  Phone Note Call from Patient Call back at Home Phone [phone]   Caller: Patient Summary of Call: pt called requesting refills of Plavix, Norvasc and Aldactazide. Pt gets medications through physician assistance (med manufacturers). Initial call taken by: Margaret Pyle, CMA,  October 06, 2009 4:00 PM  Follow-up for Phone Call        Called for Plavix refill at # [phone] and ordered via automated system.  Called for Norvasc/Aldactazide refill at # [phone] and ordered via automated system. Order #[id number]  Called to notfiy patient, no answer/no machine so I will wait for meds to arrive in 7-10 days. Follow-up by: Lucious Groves,  October 08, 2009 4:40 PM  Additional Follow-up for Phone Call Additional follow up Details #1::        ok to continue Additional Follow-up by: Corwin Levins MD,  October 08, 2009 5:07 PM

## 2010-08-17 NOTE — Progress Notes (Signed)
  Phone Note Outgoing Call   Call placed to: Patient Summary of Call: We have received patients prescriptions for Norvas and Aldactazide. Called pt left msg to call back and will inform to pickup when patient calls back Initial call taken by: Scharlene Gloss,  October 16, 2009 2:10 PM  Follow-up for Phone Call        called pt informed to pickup prescriptions at front desk Follow-up by: Scharlene Gloss,  October 16, 2009 3:03 PM

## 2010-08-17 NOTE — Letter (Signed)
Summary: Generic Letter  [facility]  [address] [place], [place] [address]   Phone: [phone]  Fax: [phone]    10/29/2009  [facility] [address] [place], [place]  [address]  RE: Yolanda [name], DOB 02-Feb-1956 S.S. # [phone]   To whom it may concern,  This letter is to advise that the above named patient's prescription for Plavix was not received by our office with shipment on 10/14/2009. I am requesting on patient behalf, a replacement of this medication to be re-sent to our office, attn Lucious Groves.  If you have questions please contact our office at the above number, Thank you.           Sincerely,    Margaret Pyle, CMA

## 2010-08-17 NOTE — Progress Notes (Signed)
  Phone Note Outgoing Call   Call placed by: Robin Call placed to: Patient Summary of Call: Called patient to inform Aldactazide and Norvasc have arrived from patient assistance and ready for pickup at front desk. Patient informed she will pickup next week (06/24/2010). Initial call taken by: Robin Ewing CMA Duncan Dull),  June 16, 2010 1:37 PM

## 2010-08-17 NOTE — Progress Notes (Signed)
  Phone Note Outgoing Call   Summary of Call: called pt to inform Plavix 75mg  medication came in and pt. can p/u at front, medication is in cabnet. Initial call taken by: Scharlene Gloss,  November 03, 2009 10:50 AM

## 2010-08-17 NOTE — Assessment & Plan Note (Signed)
Summary: B12/Yolanda [name]/CD  -- COMING AT 10:15  Nurse Visit   Allergies: 1)  ! Ace Inhibitors  Medication Administration  Injection # 1:    Medication: Vit B12 1000 mcg    Diagnosis: B12 DEFICIENCY (ICD-266.2)    Route: IM    Site: L deltoid    Exp Date: 07/2010    Lot #: [id number]    Mfr: [employer] Regent    Patient tolerated injection without complications    Given by: Rock Nephew CMA (January 07, 2010 10:38 AM)  Orders Added: 1)  Vit B12 1000 mcg [J3420] 2)  Admin of Therapeutic Inj  intramuscular or subcutaneous [96372]   Medication Administration  Injection # 1:    Medication: Vit B12 1000 mcg    Diagnosis: B12 DEFICIENCY (ICD-266.2)    Route: IM    Site: L deltoid    Exp Date: 07/2010    Lot #: [id number]    Mfr: [employer] Regent    Patient tolerated injection without complications    Given by: Rock Nephew CMA (January 07, 2010 10:38 AM)  Orders Added: 1)  Vit B12 1000 mcg [J3420] 2)  Admin of Therapeutic Inj  intramuscular or subcutaneous [14782]

## 2010-08-17 NOTE — Medication Information (Signed)
Summary: Application forms/[employer]  Application forms/[employer]   Imported By: Sherian Rein 06/07/2010 11:19:51  _____________________________________________________________________  External Attachment:    Type:   Image     Comment:   External Document

## 2010-08-17 NOTE — Medication Information (Signed)
Summary: Application forms/[facility]  Application forms/[facility]   Imported By: Sherian Rein 06/07/2010 11:17:57  _____________________________________________________________________  External Attachment:    Type:   Image     Comment:   External Document

## 2010-08-19 NOTE — Medication Information (Signed)
Summary: Norvasc/[facility]  Norvasc/[facility]   Imported By: Sherian Rein 07/08/2010 12:22:07  _____________________________________________________________________  External Attachment:    Type:   Image     Comment:   External Document

## 2010-11-30 NOTE — Discharge Summary (Signed)
NAME:  Yolanda [name], Yolanda [name]                ACCOUNT NO.:  [id number]   MEDICAL RECORD NO.:  [id number]          PATIENT TYPE:  INP   LOCATION:  4729                         FACILITY:  [facility]   PHYSICIAN:  Valerie A. Felicity Coyer, MDDATE OF BIRTH:  [birth year]   DATE OF ADMISSION:  12/19/2007  DATE OF DISCHARGE:  12/21/2007                               DISCHARGE SUMMARY   DISCHARGE DIAGNOSES:  1. Atypical chest pain.  2. Presyncope in the setting of mild bradycardia and probable mild      dehydration.  3. Acute renal insufficiency, returned to baseline with IV fluids and      diuretics on hold.  4. History of cerebrovascular accident, at baseline.  5. Hypertension, beta-blocker held during this admission secondary to      bradycardia.  6. Gram-negative rod urinary tract infection.  Plan to continue Cipro.      The patient will need followup with final culture data results and      set her appointment with Dr. Oliver Barre.  7. Mild hypokalemia  8. Mild hyponatremia, resolved.   HISTORY OF PRESENT ILLNESS:  Yolanda [name] is a 55 year old African American  female admitted on December 19, 2007, with chief complaint of presyncope and  chest pain.  She presents with worsening complaints of episodes of  lightheadedness, diaphoresis, and chest pressure which is not radiated.  It was associated with some  shortness of breath and occasional  diarrhea.  She notes similar episodes in the past but for 3-4 days prior  to this admission noted that the symptoms have been increasing in  frequency and happening almost daily.  She does report some occasional  diarrhea with this episode.  She was admitted for further evaluation and  treatment.   PAST MEDICAL HISTORY:  1. CVA with residual left-sided weakness.  2. Hypertension.  3. COPD.  4. History of heart murmur.  5. Cholecystectomy.   COURSE OF HOSPITALIZATION:  1. Atypical chest pain.  The patient was admitted and underwent serial      cardiac enzymes, which  were negative x3.  D-dimer was within normal      limits.  2. Presyncope with mild bradycardia.  The patient is noted to be      somewhat dry with acute renal insufficiency.  Creatinine of 2.0 on      admission.  Baseline creatinine 1.2 in November 2004, which is the      only lab available for comparison.  Her orthostatic blood pressure      checks were unremarkable.  Her heart rate was sustained in the 50s      and her beta-blocker was discontinued.  Her blood pressure is      currently stable in the 130s/80s.  Plan to resume her diuretics      tomorrow.  3. Gram-negative rod urinary tract infection.  Final culture data      pending.  Continue Cipro to complete a 7-day course.  4. Mild hypokalemia.  This was repleted p.o. and resolved.  5. Mild hyponatremia in setting of dehydration and diuretics,  resolved.   DISCHARGE MEDICATIONS:  1. Norvasc 10 mg p.o. daily.  2. Plavix 75 mg p.o. daily.  3. Cipro 250 mg p.o. b.i.d. for 6 days.  4. Protonix 40 mg p.o. daily.  5. Aldactazide 25/25 one tab p.o. daily to be restarted on December 22, 2007.   PERTINENT LABORATORY:  At the time of discharge, BUN 10, creatinine  1.28, hemoglobin 12.9, and hematocrit 38.1.   DISPOSITION:  The patient will be discharged to home.   DIET:  She will be maintained on a low-sodium, heart-healthy diet.   FOLLOWUP:  The patient is scheduled to follow up with Dr. Oliver Barre on  December 31, 2007, at 9:00 a.m.  She is instructed to return to the ER  should she develop worsening chest pain or dizziness.   Greater than 30 minutes was spent on discharge planning.      Sandford Craze, NP      Yolanda [name]. Felicity Coyer, MD  Electronically Signed    MO/MEDQ  D:  12/21/2007  T:  12/22/2007  Job:  [id number]   cc:   Corwin Levins, MD

## 2010-11-30 NOTE — H&P (Signed)
NAME:  Yolanda [name], Yolanda [name]                ACCOUNT NO.:  [id number]   MEDICAL RECORD NO.:  [id number]          PATIENT TYPE:  EMS   LOCATION:  MAJO                         FACILITY:  [facility]   PHYSICIAN:  Michiel Cowboy, MDDATE OF BIRTH:  1956-02-20   DATE OF ADMISSION:  12/19/2007  DATE OF DISCHARGE:                              HISTORY & PHYSICAL   PRIMARY CARE Sheketa Ende:  Dr. Jonny Ruiz.   CHIEF COMPLAINT:  Presyncope and chest pain.   Patient is a 55 year old female with a history of CVA and hypertension  who presents with worsening complaints of episodes of lightheadedness,  diaphoresis, chest pressure, which is nonradiating, some shortness of  breath, and occasional diarrhea.  She had a history of similar episodes  in the past, but for the past 3-4 days, they have been increasing in  their frequency and happening almost daily.  They will last about 30  minutes at a time.  Some are associated chest pressure.  Others do not  and just feel it is presyncope and fluttering.  The patient denies any  fevers, chills, nausea, vomiting.  Does report occasional diarrhea with  this episode.   PAST MEDICAL HISTORY:  1. CVA, residual left-sided weakness.  2. Hypertension.  3. COPD.  4. History of heart murmur.  5. Cholecystectomy in July, 2003.   SOCIAL HISTORY:  Patient still continues to smoke.  Denies drugs.  Denies alcohol use.  Lives at home.   FAMILY HISTORY:  Noncontributory.   ALLERGIES:  No known drug allergies.   MEDICATIONS:  1. Norvasc 10 mg p.o. daily.  2. Aldactazide 25/25 at bedtime.  3. Codeine 0.3 mg nightly.  4. Metoprolol 50 mg p.o. at bedtime.  5. Plavix 75 mg p.o. daily.   PHYSICAL EXAMINATION:  VITALS:  Temperature 97.1, respirations 18, heart  rate between 49-53, blood pressure 114/78, satting 100% on room air.  Patient appears to be a thin female in no acute distress, sitting down  on a stretcher.  LUNGS:  Clear to auscultation bilaterally but distant.  HEART:   Regular rate and rhythm with no murmurs, rubs or gallops.  LOWER EXTREMITIES:  Without edema.  ABDOMEN:  Soft, nontender, nondistended.  NEUROLOGIC:  Left lower extremity weakness.  Upper extremity strength  5/5 bilaterally.   LABS:  Arkin blood cell count 6.7, hemoglobin 14.3, platelets 263,  potassium 33.7, sodium 133, creatinine 2.  At baseline, creatinine 1.6.  Troponin less than 0.05.  CK-MB 1.2.  UA shows many bacteria.  Mckenzie  blood cell count elevated.   Chest x-ray shows hyperinflation but no infiltrate.   EKG showing heart rate of 53, sinus bradycardia.  There is noted Q waves  in leads V1-2.  Pretty much without significant change from EKG done in  October, 2004.   ASSESSMENT/PLAN:  This is a 55 year old female with a history of  presyncope who appears to be dehydrated and bradycardic.  1. Presyncope/chest pain:  Given history of hypertension and stroke,      we will admit on telemetry.  Cycle cardiac enzymes.  The history is  not necessarily typical of angina.  Her chest pain is variable on      presentation.  Her main complaint is a presyncope-like sensation.      Will obtain orthostatics.  Hold clonidine and metoprolol, as this      can lower heart rate, given that patient is having bradycardia.      Further risk stratify with hemoglobin A1C and a fasting lipid panel      in the a.m.  Will add 2D echo in the a.m. to evaluate for wall      motion abnormality or systolic dysfunction.  2. Bradycardia:  Will hold clonidine.  3. Metoprolol:  Put on telemetry.  4. History of stroke:  Will continue Plavix.  5. Dehydration:  Will give IV fluids.  Hold spironolactone,      hydrochlorothiazide.  6. Acute renal failure:  Creatinine increased to 2 from a baseline of      1.6.  Will give IV fluids and follow.  7. Prophylaxis:  Lovenox and Protonix.      Michiel Cowboy, MD  Electronically Signed     AVD/MEDQ  D:  12/19/2007  T:  12/19/2007  Job:  [id number]   cc:    Dr. Jonny Ruiz

## 2010-12-03 ENCOUNTER — Encounter: Payer: Self-pay | Admitting: Internal Medicine

## 2010-12-03 ENCOUNTER — Other Ambulatory Visit (INDEPENDENT_AMBULATORY_CARE_PROVIDER_SITE_OTHER): Payer: Medicare Other

## 2010-12-03 ENCOUNTER — Ambulatory Visit (INDEPENDENT_AMBULATORY_CARE_PROVIDER_SITE_OTHER): Payer: Medicare Other | Admitting: Internal Medicine

## 2010-12-03 VITALS — BP 140/102 | HR 64 | Temp 97.8°F | Ht 67.0 in | Wt 131.4 lb

## 2010-12-03 DIAGNOSIS — R634 Abnormal weight loss: Secondary | ICD-10-CM

## 2010-12-03 DIAGNOSIS — I10 Essential (primary) hypertension: Secondary | ICD-10-CM

## 2010-12-03 DIAGNOSIS — R5381 Other malaise: Secondary | ICD-10-CM

## 2010-12-03 DIAGNOSIS — E785 Hyperlipidemia, unspecified: Secondary | ICD-10-CM

## 2010-12-03 DIAGNOSIS — R5383 Other fatigue: Secondary | ICD-10-CM

## 2010-12-03 DIAGNOSIS — Z0000 Encounter for general adult medical examination without abnormal findings: Secondary | ICD-10-CM

## 2010-12-03 LAB — TSH: TSH: 1.22 u[IU]/mL (ref 0.35–5.50)

## 2010-12-03 LAB — HEPATIC FUNCTION PANEL
AST: 16 U/L (ref 0–37)
Albumin: 3.9 g/dL (ref 3.5–5.2)
Alkaline Phosphatase: 113 U/L (ref 39–117)

## 2010-12-03 LAB — CBC WITH DIFFERENTIAL/PLATELET
Basophils Absolute: 0 10*3/uL (ref 0.0–0.1)
Eosinophils Absolute: 0.1 10*3/uL (ref 0.0–0.7)
HCT: 43.5 % (ref 36.0–46.0)
Lymphs Abs: 1.2 10*3/uL (ref 0.7–4.0)
MCHC: 34 g/dL (ref 30.0–36.0)
Monocytes Absolute: 0.4 10*3/uL (ref 0.1–1.0)
Monocytes Relative: 7.8 % (ref 3.0–12.0)
Platelets: 262 10*3/uL (ref 150.0–400.0)
RDW: 14.6 % (ref 11.5–14.6)

## 2010-12-03 LAB — BASIC METABOLIC PANEL
Calcium: 9.7 mg/dL (ref 8.4–10.5)
Chloride: 105 mEq/L (ref 96–112)
Creatinine, Ser: 1 mg/dL (ref 0.4–1.2)
GFR: 77.92 mL/min (ref >60.00)

## 2010-12-03 LAB — LIPID PANEL
Cholesterol: 177 mg/dL (ref 0–200)
Triglycerides: 92 mg/dL (ref 0.0–149.0)

## 2010-12-03 MED ORDER — CLOPIDOGREL BISULFATE 75 MG PO TABS: 75.0000 mg | ORAL_TABLET | Freq: Every day | ORAL | Status: DC

## 2010-12-03 MED ORDER — AMLODIPINE BESYLATE 10 MG PO TABS: 10.0000 mg | ORAL_TABLET | Freq: Every day | ORAL | Status: DC

## 2010-12-03 MED ORDER — CLONIDINE HCL 0.3 MG PO TABS: 0.3000 mg | ORAL_TABLET | Freq: Two times a day (BID) | ORAL | Status: DC

## 2010-12-03 MED ORDER — SPIRONOLACTONE-HCTZ 25-25 MG PO TABS: 1.0000 | ORAL_TABLET | Freq: Every day | ORAL | Status: DC

## 2010-12-03 MED ORDER — METOPROLOL TARTRATE 25 MG PO TABS: 25.0000 mg | ORAL_TABLET | Freq: Every day | ORAL | Status: DC

## 2010-12-03 NOTE — Discharge Summary (Signed)
NAMEHARJOT, ZAVADIL NO.:  [id number]   MEDICAL RECORD NO.:  [id number]                   PATIENT TYPE:  IPS   LOCATION:  4032                                 FACILITY:  [facility]   PHYSICIAN:  Ellwood Dense, M.D.                DATE OF BIRTH:  09/17/1953   DATE OF ADMISSION:  05/07/2003  DATE OF DISCHARGE:  05/22/2003                                 DISCHARGE SUMMARY   DISCHARGE DIAGNOSES:  1. Right MCA infarct with visual deficits.  2. Hypertension.  3. Headaches.   HISTORY OF PRESENT ILLNESS:  Ms. Yolanda [name] is a 55 year old female with a  history of hypertension, cholecystectomy July 3 admitted October 14 with  left lower extremity and left upper extremity numbness and lightheaded  feeling that started the day before admission. MRI ____________ showed an  acute right posterior cerebral artery infarct with punctate areas in  thalamic posterior temporal and occipital lobe as well as occlusion of  segment of right PCA. Cardiac echocardiogram showed EF of 55 to 60% with  trivial MVR and TVR. No left ventricular abnormality. No thrombus. Carotid  duplex showed no ICA stenosis. Dr. Pearlean Brownie evaluated patient, and fells the  outpatient stroke was thromboembolic.  The was started on Plavix for CVA  prophylaxis. The patient has had problems with right sided headaches as well  as left lethargy that she reports secondary to clonidine. PT and OT  initiated, and the patient at min assist for bed mobility, min assist with  max cues to transfer, min assist to ambulate 5 feet, min assist for ADLs  with homonymous hemianopia noted with activity.   PAST MEDICAL HISTORY:  1. Hypertension.  2. Cholecystectomy.   ALLERGIES:  Intolerance to CATAPRES.   SOCIAL HISTORY:  The patient is married and lives in [place]. Was  independent and active, working full time prior to admission. She smokes one  pack per day, uses alcohol rarely.   HOSPITAL COURSE:  Ms. Yolanda [name] was admitted to rehab on May 07, 2003  for inpatient therapies to consist of PT and OT daily. The past admission,  she was maintained on Plavix for CVA prophylaxis. She was started on subcu  Lovenox for DVT prophylaxis secondary to left hemiparesis, left lower  greater than left upper. She was noted to have decrease in sensation on the  left side with decreased vision on the left side. Blood pressures were  monitored on a b.i.d. basis and initially showed good control. Catapres was  decreased to q.h.s. secondary to reports of increased sedation and  noncompliance a day prior to admission. The patient's Catapres was increased  to 0.3 mg q.h.s. prior to discharge. Initially, the patient had chronic  problems with headaches, and Vicodin was used on scheduled basis. At time of  discharge, this has been tapered with patient instructed to use Vicodin on  p.r.n. basis with Tylenol  and/or ibuprofen to be used as supplemental use.   Labs done at admission showed H and H to be stable at 14.9 and 44.0. Check  of electrolytes shows sodium 140, potassium 5.0, chloride 107, CO2 27, BUN  14, creatinine 1.2, glucose 97. SGOT 19, SGPT 18. TSH was 2.327.   The patient continued with homonymous hemianopia as well as blurred vision  that was a limiting factor. She had decrease in left lower extremity  strength but continued to be hesitant about putting full weight on left  lower extremity. Was able to do so with cueing. Speech therapy evaluation  showed patient's recall to be intact. She was aware of deficits and was able  to compensate for this. Basic high level comprehensive was intact. Basic  high level expression intact. No signs of apraxia or dysarthria. The patient  was modified independent at wheelchair level for toileting transfers. She  was a supervision level for bathing secondary to safety issues related to  decrease in sensation and decrease in vision as well as occasional increased   dizziness at times. The patient is modified independent for dressing from  wheelchair or bed level. She is able to compensate for visual deficits most  times; however, occasionally has blurry vision which tends to be a limiting  factor. In terms of mobility, she was modified independent for transfers at  supervision for ambulating greater than 150 feet with rolling walker. Again,  supervision is secondary to her decrease in visual activity as well as depth  perception as well as decrease in sensation of left lower extremity which  affects the reliability of foot placement. A prefab AFO was ordered to help  with efficiency of left lower extremity swing through gait. Strength and  coordination of left lower extremity was much improved; however, she still  required constant three techniques to use this to get in and out of bed.  Further followup therapies were set up to include outpatient PT at [facility] to begin on May 28, 2003. On May 22, 2003, the  patient is discharged to home with supervision to be provided by family.   DISCHARGE MEDICATIONS:  1. Plavix 75 mg a day.  2. Norvasc 10 mg a day.  3. Aldactone 25 mg b.i.d.  4. Clonidine 0.3 mg q.h.s.  5. Vicodin one p.o. t.i.d. to q.i.d. p.r.n.  6. Senokot S two p.o. q.h.s.   PAIN MANAGEMENT:  May use Vicodin. Will start the day with two Tylenol  q.a.m.   ACTIVITY:  Supervision.   DIET:  Low fat.   SPECIAL INSTRUCTIONS:  No alcohol. No smoking. No driving. Do not take  hydrochlorothiazide or potassium supplement currently. [facility]  outpatient rehab to begin November 10 at 10 a.m.   FOLLOW UP:  The patient to followup with Dr. Pearlean Brownie. Followup with Dr. Yetta Barre  in three weeks for recheck including BP check. Followup with Dr. Thomasena Edis  December 15 at 2:20 p.m. Followup with Dr. Karleen Hampshire November 18 at 9:30 a.m.     Greg Cutter, P.A.                    Ellwood Dense, M.D.    PP/MEDQ  D:   06/10/2003  T:  06/10/2003  Job:  [id number]   cc:   Corwin Levins, M.D. [facility]   Pramod P. Pearlean Brownie, MD  Fax: [phone]

## 2010-12-03 NOTE — Progress Notes (Signed)
Subjective:    Patient ID: Yolanda [name], female    DOB: [birth year], 55 y.o.   MRN: [id number]  HPI Here to f/u; overall doing ok,  Pt denies chest pain, increased sob or doe, wheezing, orthopnea, PND, increased LE swelling, palpitations, dizziness or syncope.  Pt denies new neurological symptoms such as new headache, or facial or extremity weakness or numbness   Pt denies polydipsia, polyuria  Pt states overall good compliance with meds, trying to follow lower cholesterol diet, wt overall stable but little exercise however.   Has lost 9 lbs since nov 2011. Denies worsening depressive symptoms, suicidal ideation, or panic.  Pt denies fever, wt loss, night sweats, loss of appetite, or other constitutional symptoms  Overall good compliance with treatment, and good medicine tolerability. No overt bleeding or bruising.  BP at home has been < 140/90, in fact has been too low on occasion without weakness or dizziness (lowest at 86/53 maybe once per month, but usually more like 110/70).  Not smoking. Does have sense of ongoing fatigue, but denies signficant hypersomnolence. Past Medical History  Diagnosis Date  . B12 DEFICIENCY 12/01/2009  . VITAMIN D DEFICIENCY 06/03/2010  . HYPERLIPIDEMIA 10/25/2007  . ANXIETY 10/25/2007  . DEPRESSION 10/25/2007  . HEMIPARESIS, LEFT 11/23/2009  . Unspecified visual loss 11/23/2009  . MITRAL INSUFFICIENCY 10/25/2007  . HYPERTENSION 10/25/2007  . BRADYCARDIA 11/19/2009  . PERIPHERAL VASCULAR DISEASE 10/25/2007  . ALLERGIC RHINITIS 10/25/2007  . COPD 10/25/2007  . GERD 10/25/2007  . BACK PAIN 10/25/2007  . OSTEOPOROSIS 10/25/2007  . OSTEOPENIA 11/19/2009  . INSOMNIA-SLEEP DISORDER-UNSPEC 10/25/2007  . FATIGUE 10/25/2007  . Headache 10/25/2007  . CHEST PAIN 12/31/2007  . CEREBROVASCULAR ACCIDENT, HX OF 10/25/2007   Past Surgical History  Procedure Date  . Cholecystectomy   . Tubal ligation     reports that she has been smoking.  She does not have any smokeless tobacco history on file. She reports  that she drinks alcohol. She reports that she does not use illicit drugs. family history includes Alcohol abuse in her other; Arthritis in her other; Depression in her other; Heart disease in her other; Hypertension in her other; and Stroke in her other. Allergies  Allergen Reactions  . Ace Inhibitors    Current Outpatient Prescriptions on File Prior to Visit  Medication Sig Dispense Refill  . cholecalciferol (VITAMIN D) 1000 UNITS tablet Take 1,000 Units by mouth daily.        . ferrous sulfate 325 (65 FE) MG tablet Take 325 mg by mouth. 1 by mouth daily for 3 months       . Multiple Vitamin (MULTIVITAMIN) tablet Take 1 tablet by mouth daily.         Review of Systems All otherwise neg per pt     Objective:   Physical Exam BP 140/102  Pulse 64  Temp(Src) 97.8 F (36.6 C) (Oral)  Ht 5\' 7"  (1.702 m)  Wt 131 lb 6 oz (59.591 kg)  BMI 20.58 kg/m2  SpO2 98% Physical Exam  VS noted Constitutional: Pt appears well-developed and well-nourished.  HENT: Head: Normocephalic.  Right Ear: External ear normal.  Left Ear: External ear normal.  Eyes: Conjunctivae and EOM are normal. Pupils are equal, round, and reactive to light.  Neck: Normal range of motion. Neck supple.  Cardiovascular: Normal rate and regular rhythm.   Pulmonary/Chest: Effort normal and breath sounds normal.  Abd:  Soft, NT, non-distended, + BS Neurological: Pt is alert. No cranial nerve deficit.  Skin: Skin is warm. No erythema.  Psychiatric: Pt behavior is normal. Thought content normal.         Assessment & Plan:

## 2010-12-03 NOTE — Patient Instructions (Addendum)
Continue all other medications as before Please go to LAB in the Basement for the blood and/or urine tests to be done today Please go to XRAY in the Basement for the x-ray test Please call the phone number [phone] (the PhoneTree System) for results of testing in 2-3 days;  When calling, simply dial the number, and when prompted enter the MRN number above (the Medical Record Number) and the # key, then the message should start. All of your medications were refilled to [facility] Please see Latoya as you leave to help with the Patient Assistance Program applications Please return in 6 months, or sooner if needed

## 2010-12-03 NOTE — H&P (Signed)
[name]  Patient:    Yolanda [name], Yolanda [name] Visit Number: [id number] MRN: [id number]          Service Type: SUR Location: [id number] Attending Physician:  Caleen Essex Dictated by:   Ollen Gross. [name], M.D. Admit Date:  01/26/2002                           History and Physical  HISTORY OF PRESENT ILLNESS:  The patient is a 55 year old black female who recently underwent surgery in [place] for a cholecystitis with cholelithiasis.  She underwent laparoscopic cholecystectomy by Dr. ______ who did an intraoperative cholangiogram.  Apparently, there were some filling defects by report and approximately two weeks later she underwent an ERCP by Dr. Dairl Ponder that did not demonstrate any stones or leakage from the bile duct and a sphincterotomy was done.  From the time of her surgery, she has continued to have some right upper quadrant pain that has never improved.  She has woken up at night with some sweats but has never taken her temperature to document any fevers or chills.  She has had several episodes of nausea with vomiting but has been able to keep some liquids down.  She otherwise denies chest pain, shortness of breath, diarrhea, or dysuria.  Her other review of systems is unremarkable.  PAST MEDICAL HISTORY:  Hypertension.  PAST SURGICAL HISTORY:  Tubal ligation and laparoscopic cholecystectomy and subsequent ERCP.  MEDICATIONS: 1. Norvasc 10 mg a day. 2. Clonidine 0.2 mg t.i.d. 3. Lisinopril 20 mg b.i.d. 4. Corzide 40/5 one p.o. q.d.  ALLERGIES:  She has no known drug allergies.  SOCIAL HISTORY:  She smokes about a pack of cigarettes a day and denies any alcohol use.  FAMILY HISTORY:  Unremarkable.  PHYSICAL EXAMINATION:  VITAL SIGNS:  Temperature 97.9, blood pressure 154/96, pulse 57.  GENERAL:  She is a well-developed, well-nourished black female in no acute distress.  SKIN:  Warm and dry.  No jaundice.  HEENT:  Her extraocular  muscles are intact.  Pupils are round and reactive to light.  LUNGS:  Clear.  HEART:  Regular rate and rhythm.  ABDOMEN:  Soft with right upper quadrant tenderness and no obvious peritonitis.  EXTREMITIES:  No cyanosis, clubbing, or edema.  NEUROLOGIC:  She is alert and oriented x4.  HEMATOLOGIC:  No lymphadenopathy.  LABORATORY DATA:  Her liver function tests are pending.  Her Kobrin count is 5200 with 52% segs, 35% lymphs, and 9% monos.  Hemoglobin is 12.2 and platelet count is 338,000.  She also underwent a CT scan today that showed a fluid collection with an enhancing rim in the gallbladder fossa.  ASSESSMENT AND PLAN:  This is a 55 year old [place] female who is now 3-4 weeks out from a laparoscopic cholecystectomy who has had continued pain in that area and has now a CT scan demonstrating an enhancing fluid collection in the gallbladder fossa.  I suspect this is either an abscess or biloma and will require a drainage procedure.  I have discussed this with the radiologists and they felt that this would be readily amenable to a percutaneous drain procedure under ultrasound or CT guidance.  We will plan to admit her to the hospital and do this for her as soon as it can be arranged.  I explained this to her and she would like to stay and have this done. Dictated by:   Ollen Gross. [name],  M.D. Attending Physician:  Caleen Essex DD:  01/26/02 TD:  01/29/02 Job: [id number]

## 2010-12-03 NOTE — Assessment & Plan Note (Signed)
Yolanda [name] is a 55 year old adult female with history of hypertension and  prior cholecystectomy.  She was admitted May 01, 2003 with left-sided  weakness and numbness along with light headiness.  MRI study showed acute  posterior cerebral artery infarction involving the posterior temporal and  occipital lobe as well as an occlusion of the segment of the right posterior  cerebral artery.   The patient was on the rehabilitation unit and was subsequently discharged  home.   The patient reports that she continues to have problems with her vision at  the present time.  She remains extremely photosensitive and reportedly has  been told by her neuro-ophthalmologist Dr. Karleen Hampshire that he did not expect  her vision to improve substantially.  She has a person coming from [facility] to help her with some paper work for visually impaired individuals.   The patient continues to receive outpatient physical therapy approximately  once per month.  She has made minimal progress and they are seeing her on a  periodic basis.  She has finished occupational therapy completely.  She  lives with her husband.  She does walk with a cane or walker inside the  house and is a wheelchair in the office today.  She does use Hydrocodone  approximately 3 per day for headache and neck pain.  She reports that Dr.  Pearlean Brownie is planning a follow up MRI study over the next couple of weeks.  She  is mostly independent with bathing and dressing although she does need some  help with hair styling.   MEDICATIONS:  1. Plavix 75 mg daily.  2. Hydrocodone 5/500 one tablet t.i.d. p.r.n.  3. Clonidine 0.3 mg q.h.s.  4. Norvasc 10 mg daily.  5. Benicar 1 tablet daily.  6. Spironolactone 25 mg daily.  7. Multivitamin daily.   PHYSICAL EXAMINATION:  GENERAL:  This is a reasonably well-appearing middle-  aged adult female.  VITAL SIGNS:  Blood pressure 184/93 with a pulse of 61, her O2 saturation  100% on room air.  NEUROLOGIC:  She has strength of 5/5 throughout the right upper and lower  extremity.  Left-sided upper extremity strength was 4+/5 and the left lower  extremity strength was approximately 4/5 in hip flexion, knee extension and  ankle dorsiflexion.   IMPRESSION:  1. Status post right middle cerebral artery infarct with extensive residual     deficits including left-sided weakness.  2. Hypertension, poorly controlled.   The patient reports that she was started on Benicar by primary care  physician Dr. Jonny Ruiz.  She is due for a follow up September 04, 2003.  She unfortunately has not  been able to take the Benicar as her primary care physician needs to do some  paper work for that medication for her, according to her report.  Her blood  pressure remains out of control at the present time.   I have refilled her Plavix in the office today.  She reports that she has  sufficient Hydrocodone at the present time.   I plan on seeing the patient in follow up in approximately 2 month's time.      Ellwood Dense, M.D.   DC/MedQ  D:  09/01/2003 11:29:13  T:  09/01/2003 12:51:17  Job #:  [id number]

## 2010-12-03 NOTE — Discharge Summary (Signed)
   NAME:  Yolanda [name], Yolanda [name] NO.:  [id number]   MEDICAL RECORD NO.:  [id number]                   PATIENT TYPE:   LOCATION:                                       FACILITY:   PHYSICIAN:  Ollen Gross. [name], M.D.              DATE OF BIRTH:  09/17/1953   DATE OF ADMISSION:  01/26/2002  DATE OF DISCHARGE:  02/01/2002                                 DISCHARGE SUMMARY   BRIEF HISTORY:  The patient is a 55 year old black female who presented  approximately four weeks status post a laparoscopic cholecystectomy for  cholecystitis and cholelithiasis in [place].  She had right upper  quadrant pain since the time of her surgery.  During her postop course in  [place], she had an ERCP done approximately two weeks after surgery that  showed no filling defects.   HOSPITAL COURSE:  She had a CT scan done at the time of admission to the  hospital on July 12 that showed an enhancing fluid collection in the  gallbladder fossa of the liver worrisome for abscess versus biloma versus a  hematoma.  She was brought into the hospital for pain control.  Radiology  was asked to see her to consider a percutaneous drain of this area, and they  were able to do this without much difficulty.  She also had episodes of  bradycardia during the hospitalization that were asymptomatic.  She was  watched on telemetry and was evaluated by the medical service for  bradycardia and hypertension.  She slowly improved during hospitalization to  the point where she was able to tolerate a diet.  She had taken a  significant amount of pain medicine to control her pain, but by 02/01/2002,  she was able to control her pain with oral pain medicines, was tolerating a  diet well.  Her drain was discontinued, and she was ready for discharge.  She was discharged on 02/01/2002.   DISCHARGE MEDICATIONS:  She was to resume her home medications, and she was  given prescription for Vicodin for pain control.   DIET:  As tolerated.   CONDITION ON DISCHARGE:  Stable.   FINAL DIAGNOSIS:  Postoperative hematoma after laparoscopic cholecystectomy.   FOLLOW UP:  She will follow up with [place] in the next couple of weeks, and she  is discharged home.                                               Ollen Gross. [name], M.D.    PST/MEDQ  D:  06/27/2002  T:  06/28/2002  Job:  [id number]

## 2010-12-03 NOTE — Discharge Summary (Signed)
NAME:  Yolanda [name], Yolanda [name]                            ACCOUNT NO.:  [id number]   MEDICAL RECORD NO.:  [id number]                   PATIENT TYPE:  INP   LOCATION:  4004                                 FACILITY:  [facility]   PHYSICIAN:  Pramod P. Pearlean Brownie, MD                 DATE OF BIRTH:  09/17/1953   DATE OF ADMISSION:  05/01/2003  DATE OF DISCHARGE:  05/07/2003                                 DISCHARGE SUMMARY   ADMISSION DIAGNOSIS:  Right brain stroke.   DISCHARGE DIAGNOSES:  1. 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  2. Hypertension.   HISTORY OF PRESENT ILLNESS:  The patient is a pleasant 55 year old lady who  was admitted for evaluation of left-sided paresthesias.  Can receive  admission H&P from Genene Churn. Sandria Manly, M.D., dated May 01, 2003, for details.  The patient had longstanding history of uncontrolled hypertension and some  left-sided paresthesias.  On evaluation in the emergency room, she had left-  sided homonymous hemianopsia and some left-sided sensory disturbance, but no  weakness or sensory loss.  Admission CAT scan of the head did not show any  acute changes of stroke.  An MRI scan of the brain was obtained the next day  which showed the right posterior cerebral artery infarction involving the  occipital lobe and thalamus on the right.  MRA showed occlusion of the right  posterior cerebral artery in its proximal portion.  Transthoracic  echocardiogram showed normal ejection fraction without any obvious  cardioembolic source.  Transcranial Doppler studies showed proximal  waveforms in the anterior circulation but normal velocities in the posterior  circulation.  Carotid ultrasound did not reveal significant carotid  stenosis.  Cholesterol, hemoglobin A1C, and homocysteine were all normal.  The patient was seen in consultation by Titus Dubin. Alwyn Ren, M.D. [facility] from  internal medicine and his help for adjustment of  antihypertensives and  managing low potassium was appreciated.  She was also seen in consultation  by rehab and thought to be a good candidate for rehab.   HOSPITAL COURSE:  She was started on Plavix 75 mg a day for secondary stroke  prevention and counseled to quit smoking.  She was instructed to follow up  with Dr. Pearlean Brownie in his office in two months and with her primary physician,  Corwin Levins, M.D. [facility], in two weeks.  At the time of discharge, she still  have left homonymous hemianopsia and some mild dysesthesia on the left.   DISCHARGE MEDICATIONS:  1. Plavix 75 mg a day.  2. Hydrochlorothiazide 25 mg a day.  3. Potassium chloride 20 mEq twice a day.  4. Cozaar 50 mg twice a day.   The patient was also advised to follow up with her primary physician for  further adjustment of antihypertensives and smoking cessation.  Pramod P. Pearlean Brownie, MD    PPS/MEDQ  D:  07/18/2003  T:  07/18/2003  Job:  [id number]   cc:   Corwin Levins, M.D. [facility]

## 2010-12-03 NOTE — H&P (Signed)
NAME:  Yolanda [name], Yolanda [name] NO.:  [id number]   MEDICAL RECORD NO.:  [id number]                   PATIENT TYPE:  EMS   LOCATION:  MAJO                                 FACILITY:  [facility]   PHYSICIAN:  Genene Churn. Love, M.D.                 DATE OF BIRTH:  09/17/1953   DATE OF ADMISSION:  05/01/2003  DATE OF DISCHARGE:                                HISTORY & PHYSICAL   CHIEF COMPLAINT:  This is a second [facility] admission for this 55-  year-old, right-handed black married female from [place], [place],  admitted from the emergency room for evaluation of left-sided numbness and  hypertension.   HISTORY OF PRESENT ILLNESS:  This patient has a 10-year history of  hypertension, which, according to her, has been poorly controlled despite  good compliance with medications.  She has had blood pressures documented at  home at 189/104 and 254/180 range.  The last blood pressure one week ago was  189/140 at home.   She had a cholecystectomy in [place] in July 2003, which was complicated  by recurrent abdominal pain and was transferred to [facility] January 26, 2002.  She had some fluid collection in the gallbladder, and it was  suspected to be an abscess or a biloma at that level versus a hematoma.   In the hospital, she had hypertension and bradycardia and was seen in  consultation by Dr. Olga Millers, Cardiologist, with [facility].  At the time she was in the hospital, she was on Amlodipine, clonidine,  Lisinopril, Corgard, hydrochlorothiazide and Lasix.  She had a CT scan of  the abdomen and pelvis at that time.  Her bradycardia was asymptomatic.  She  has been taking four-drug therapy for hypertension as an outpatient but not  on aspirin, and, yesterday, at 9:30 a.m., noted left leg and then arm  numbness while walking with a lightheaded sensation, not associated with  chest pain, palpitations, or headache, and, today, because  of continued  symptoms came to the emergency room.   She has not been on aspirin therapy.  She has no known history of  hyperlipidemia, diabetes mellitus, heart attack, or previous history of  stroke, or hormone replacement therapy.  She does smoke one pack per day of  cigarettes.   PAST MEDICAL HISTORY:  1. Significant for hypertension for 10 years.  2. Cholecystectomy in July 2003.  3. COPD.  4. Heart murmur.   MEDICATIONS:  1. Include Norvasc 10 mg q.d.  2. Hydrochlorothiazide 50 mg q.d.  3. KCl 20 mEq q.d.  4. Clonidine HCL 0.2 mg 1 t.i.d.  5. Cozaar 50 mg q.d.  6. She is not on aspirin.   ALLERGIES:  She has no history of allergies.   SOCIAL HISTORY:  She drinks one alcohol drink per month.  She smokes one  pack per day of  cigarettes.   FAMILY HISTORY:  Her mother is 1, living and well.  Her father died in his  41s of unknown causes.  She has brothers, 78, 48, 46 and 45 living well.  She has sisters 11 and 76 living and well.  She had three daughters, one is  66 and one is 40; she had one daughter that died at 100 from a stab wound.  Patient has had no other hospitalizations, injuries or serious medical  problems.   PHYSICAL EXAMINATION:  GENERAL:  Reveals a well-developed black female in no  acute distress, who is lying in bed.  Her gait was not evaluated.  VITAL SIGNS:  Her blood pressure in the right and left arm was 220/120, and  this was repeated; the heart rate was 66; there were no bruits; she was  afebrile.  MENTAL STATUS:  She was alert, oriented x 3.  Followed one, two and three  step commands.  There is no evidence of an aphasia, agnosia, or apraxia.  Her cranial nerve examination revealed visual fields were full.  The discs  were flat.  She had hypertensive eye grounds.  The corneas were present.  There was no seventh nerve palsy.  The tongue was midline.  The gags were  present.  Sternocleidomastoid and trapezius testing were normal.  Motor  examination  revealed 5/5 strength in the upper and lower extremities.  She  had good coordination, maybe some mild clumsiness in the left hand and arm.  Sensory examination was intact to pinprick, touch, resisting, and vibration,  except for subjective decreased pinprick in the left face, arm and leg as  well as vibration.  Deep tendon reflexes were 2+, and plantar responses were  downgoing.  HEENT:  Right tympanic membrane was clear.  The left was not well seen.  The  thyroid is not enlarged.  LUNGS:  Clear to auscultation.  HEART:  Revealed a systolic murmur at the apex.  ABDOMEN:  There was no enlargement of liver, spleen, or kidneys.  Bowel  sounds were normal.  EXTREMITIES:  There is no cyanosis, clubbing or edema.   LABORATORY DATA:  Revealed a 12-lead EKG, which was normal.   IMPRESSION:  1. Suspect right brain stroke, Code 434.01, possibly thalamic involvement.  2. Hypertension, Code 796.2.  3. Chronic obstructive pulmonary disease, Code 496.0  4. Mitral insufficiency murmur.   PLAN:  The plan at this time is to admit for MRI, MRA, consult [place]  regarding blood pressure, and place her on aspirin.                                                 Genene Churn. Sandria Manly, M.D.    JML/MEDQ  D:  05/01/2003  T:  05/01/2003  Job:  [id number]   cc:   Olga Millers, M.D.   Corwin Levins, M.D. [facility]

## 2010-12-03 NOTE — Assessment & Plan Note (Signed)
REASON FOR VISIT:  Yolanda [name] is a 55 year old adult female with history of  hypertension and cholecystectomy.  She was admitted May 01, 2003 with  left lower extremity and left upper extremity numbness and lightheadedness.  MRI study showed an acute right posterior cerebral artery infarct with  punctate area of the thalamic and posterior temporal and occipital lobe as  well as occlusion of this segment of the right posterior cerebral artery.  Echocardiogram showed ejection fraction of 55-60% with trivial mitral and  tricuspid regurgitation.  No left ventricular abnormality was identified,  nor was any thrombus identified.  Carotid duplex showed no internal carotid  artery stenosis.   The patient was started on Plavix for stroke prophylaxis.   She eventually stabilized, was moved to the rehabilitation unit May 07, 2003, and remained there to discharge on May 22, 2003.   Since discharge the patient has been living at her home in [place].  She is  attending outpatient physical and occupational therapy twice weekly.  She  has been seen by Dr. Jonny Ruiz at [facility] and told to finish her Cozaar and they  are planning to start her on a new blood pressure medication.   The patient plans to see Dr. Pearlean Brownie in January 2005.   The patient did see Dr. Karleen Hampshire, a local neurophthalmologist.  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  They did  request that she obtain new glasses which may give her some benefit.  They  also set her up for low vision evaluation at [facility].  She is  due for a follow-up in that office for the low vision in approximately the  next couple of weeks.   The patient reports that she does minimal standing at the present time and  is extremely wobbly.  She reports that she continues to drag her left leg  and does no particular walking.  She is doing  bathing and dressing in a  seated position at the present time.   MEDICATIONS:  1. Spironolactone 25 mg daily.  2. Cozaar 50 mg daily - to stop soon.  3. Norvasc 10 mg daily.  4. Clonidine 0.3 mg daily.  5. Hydrocodone 5/500 approximately three per day for headache pain.  6. Plavix 75 mg daily.   PHYSICAL EXAMINATION:  Reasonably well-appearing adult female with blood  pressure 192/99 with pulse of 73 and O2 saturation of 95% on room air.  She  has strength of 5/5 throughout the right arm and leg.  Reflexes were 2+ and  symmetrical and sensation was intact to light touch throughout the right  side.  Left upper extremity strength was 3+ to 4 over 5 and left lower  extremity strength was 3-/5.  The patient did not stand in the office today.  Examination of her vision shows very poor acuity with ability to recognize  only large objects.  She is unable to read without magnification of the  words and letters at the present time.   IMPRESSION:  1. Status post right middle cerebral artery infarct with extensive residual     deficits and left-sided weakness.  2. Hypertension, poorly controlled.   At the present time the patient's blood pressure medicines are being  adjusted by Dr. Jonny Ruiz, her primary care physician at [facility].  She  has seen Dr. 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  They are setting her up  for new glasses and having her seen by the people at the [facility] in the low vision area.   At this point I have been asked to complete paperwork for the patient and I  will complete that paperwork and send it to her in the self-addressed  envelope she has provided.  We have refilled her clonidine at 0.3 mg one  tablet p.o. q.h.s. - a total of 30 with two refills.  She will continue in  outpatient therapies at the present time.  She still has good potential to  make improvements in terms  of her ambulation but I doubt that she will be  completely independent and will certainly need a device for an extended  period of time if not indefinitely.  I am less optimistic in terms of her  vision given the recent evaluation with Dr. Karleen Hampshire.  It certainly appears  that she is unemployable at the present time and will likely remain so  unless her vision substantially improves.   I will plan to see the patient in follow-up in approximately two months'  time.      Ellwood Dense, M.D.   DC/MedQ  D:  07/02/2003 15:23:53  T:  07/02/2003 16:08:04  Job #:  [id number]

## 2010-12-03 NOTE — Progress Notes (Signed)
Quick Note:  Voice message left on [employer] system - lab is negative, normal or otherwise stable, pt to continue same tx ______ 

## 2010-12-03 NOTE — Consult Note (Signed)
[facility]  Patient:    Yolanda [name], Yolanda [name] Visit Number: [id number] MRN: [id number]          Service Type: SUR Location: 3700 [id number] Attending Physician:  Caleen Essex Dictated by:   Madolyn Frieze Jens Som, M.D. [facility] Proc. Date: 01/28/02 Admit Date:  01/26/2002                            Consultation Report  HISTORY OF PRESENT ILLNESS:  The patient is an extremely pleasant 55 year old female with no prior cardiac history who we are asked to evaluate for bradycardia.  The patient states she did have an echocardiogram and a stress test in [place] approximately two years ago secondary to chest pain that were unremarkable, but she has had no problems since.  She does have a long history of hypertension.  She recently had Clonidine as well as Corzide added.  She recently underwent laparoscopic cholecystectomy in [place].  She had persistent pain and was admitted to the surgery service here had a percutaneous drain placed.  Since admission, she has been noted to be bradycardic.  Her initial electrocardiograms revealed a marked sinus bradycardia with a rate between 39 and 42, first degree AV block, and nonspecific ST changes.  On telemetry, she has been noted to have heart rates as low as the 20s with occasional Wenckebach.  Because of these symptoms, we were asked to further evaluate.  Of note, the patient denies any dyspnea on exertion, orthopnea, PND, pedal edema, palpitations, presyncope, syncope, or chest pain.  MEDICATIONS: 1. Amlodipine 10 mg 2 q.d. 2. Clonidine 0.2 mg p.o. t.i.d. 3. Lisinopril 20 mg p.o. b.i.d. 4. Corgard 40 mg p.o. q.d. 5. Hydrochlorothiazide 50 mg p.o. q.d. 6. Potassium 20 mEq p.o. q.d. 7. Zofran. 8. Lasix 40 mg p.o. q.d. 9. The p.r.n. medications.  PAST MEDICAL HISTORY:  Significant for hypertension but there is no diabetes mellitus or hyperlipidemia by report.  She does have a history of gastroesophageal reflux  disease.  She has had a prior cholecystectomy as outlined in the history of present illness and she is status post tubal ligation.  The remaining past medical history is unremarkable.  SOCIAL HISTORY:  She does smoke but only rarely consumes alcohol.  FAMILY HISTORY:  Positive for coronary artery disease in her mother who had a myocardial infarction in her 15s.  Her father had a prior CVA.  ALLERGIES:  She has no known drug allergies.  REVIEW OF SYSTEMS:  She denies any headaches, fevers, or chills.  There is no productive cough or hemoptysis.  There is no dysphagia, odynophagia, melena, or hematochezia.  There is no dysuria or hematuria.  There is no rash or seizure activity.  There is no orthopnea, PND, pedal edema.  She does have some abdominal pain from her recent surgery.  The remainder review of systems are negative.  PHYSICAL EXAMINATION:  VITAL SIGNS:  Blood pressure of 128/78 and a pulse is in the 40 range.  She is afebrile.  GENERAL:  She is well-developed, well-nourished and in no acute distress.  SKIN:  Warm and dry.  HEENT:  Unremarkable with normal eyelids.  NECK:  Supple with normal upstrokes bilaterally and there are no bruits noted. There was no jugular venous distension and no thyromegaly noted.  CHEST:  Clear to auscultation.  Normal expansion.  CARDIOVASCULAR:  Bradycardic rate but a regular rhythm.  There are no murmurs, rubs, or gallops noted.  ABDOMEN:  She is status post abdominal surgery and there is a percutaneous drain in place.  There is mild diffuse tenderness.  Her bowel sounds are hypoactive.  I can appreciate no masses.  PULSES:  She has 2+ femoral pulses bilaterally and no bruits.  EXTREMITIES:  No edema and I can palpate no cords.  She has 2+ dorsalis pedis pulses bilaterally.  NEUROLOGICAL:  Grossly intact.  LABORATORY DATA:  A [place] blood cell count of 3.2 with a hemoglobin of 10.7 and hematocrit of 32.4.  Her platelet count is  288,000.  Her sodium is 136, potassium of 3.1.  Her chloride is 105 with a CO2 of 26 and a glucose of 111. BUN and creatinine are 9 and 1.0.  Her liver functions were normal.  She did have one set of enzymes drawn that were normal.  Her TSH was also normal.  Her EKG is described above.  DIAGNOSES: 1. Asymptomatic bradycardia. 2. Hypertension. 3. Status post cholecystectomy with percutaneous drain placement at present. 4. Gastroesophageal reflux disease.  PLAN:  The patient is for evaluation of bradycardia.  She is asymptomatic with no shortness of breath, fatigue, or syncope.  Her baseline electrocardiogram reveals a first degree AV block but no other conduction abnormalities.  I think it is very likely that her bradycardia is being exacerbated by her medications including Clonidine as well as Corgard.  I agree with discontinuing all AV nodal blocking agents and also the Clonidine.  We will continue to follow her on telemetry and if her heart rate improves then she will not require further workup.  If it remains decreased, then we may need to proceed with an exercise treadmill to evaluate chronotropic competence.  We will also need to follow her blood pressure closely.  If it increases, then we can add additional medications that do not cause bradycardia.  Of note, her TSH was normal.  We will continue to follow.Dictated by:   Madolyn Frieze. Jens Som, M.D. [facility] Attending Physician:  Caleen Essex DD:  01/28/02 TD:  01/29/02 Job: [id number]

## 2010-12-05 ENCOUNTER — Encounter: Payer: Self-pay | Admitting: Internal Medicine

## 2010-12-05 DIAGNOSIS — Z0000 Encounter for general adult medical examination without abnormal findings: Secondary | ICD-10-CM | POA: Insufficient documentation

## 2010-12-05 NOTE — Assessment & Plan Note (Signed)
Unclear etiology, exam bening, for labs as above, Continue all other medications as before

## 2010-12-05 NOTE — Assessment & Plan Note (Signed)
stable overall by hx and exam, most recent lab reviewed with pt, and pt to continue medical treatment as before  Lab Results  Component Value Date   LDLCALC 99 12/03/2010

## 2010-12-05 NOTE — Assessment & Plan Note (Addendum)
Unclear etiology, exam benign, for lab and xray today,  to f/u any worsening symptoms or concerns

## 2010-12-05 NOTE — Assessment & Plan Note (Signed)
stable overall by hx and exam, most recent lab reviewed with pt, and pt to continue medical treatment as before BP Readings from Last 3 Encounters:  12/03/10 140/102  06/03/10 110/84  11/19/09 110/70   Lab Results  Component Value Date   WBC 4.8 12/03/2010   HGB 14.8 12/03/2010   HCT 43.5 12/03/2010   PLT 262.0 12/03/2010   CHOL 177 12/03/2010   TRIG 92.0 12/03/2010   HDL 59.70 12/03/2010   ALT 11 12/03/2010   AST 16 12/03/2010   NA 140 12/03/2010   K 4.1 12/03/2010   CL 105 12/03/2010   CREATININE 1.0 12/03/2010   BUN 8 12/03/2010   CO2 28 12/03/2010   TSH 1.22 12/03/2010

## 2010-12-07 LAB — PROTEIN ELECTROPHORESIS, SERUM
Albumin ELP: 55.1 % — ABNORMAL LOW (ref 55.8–66.1)
Alpha-1-Globulin: 4.7 % (ref 2.9–4.9)
Alpha-2-Globulin: 10.2 % (ref 7.1–11.8)
Beta 2: 5.1 % (ref 3.2–6.5)
Gamma Globulin: 18.4 % (ref 11.1–18.8)

## 2010-12-22 DIAGNOSIS — Z0279 Encounter for issue of other medical certificate: Secondary | ICD-10-CM

## 2011-04-14 LAB — DIFFERENTIAL
Basophils Absolute: 0.1
Basophils Relative: 1
Lymphocytes Relative: 27
Neutro Abs: 4.1
Neutrophils Relative %: 60

## 2011-04-14 LAB — CARDIAC PANEL(CRET KIN+CKTOT+MB+TROPI)
CK, MB: 0.7
CK, MB: 1
Relative Index: INVALID
Total CK: 58
Troponin I: <0.01

## 2011-04-14 LAB — CBC
HCT: 38.1
HCT: 46.1 — ABNORMAL HIGH
Hemoglobin: 12.9
Hemoglobin: 13.6
Hemoglobin: 15.7 — ABNORMAL HIGH
MCV: 86
MCV: 86.1
Platelets: 180
Platelets: 186
Platelets: 263
RBC: 4.43
RDW: 13.6
RDW: 14
WBC: 3.7 — ABNORMAL LOW
WBC: 4.3

## 2011-04-14 LAB — PROTIME-INR: Prothrombin Time: 12.6

## 2011-04-14 LAB — URINE CULTURE

## 2011-04-14 LAB — POCT CARDIAC MARKERS
CKMB, poc: 1.2
Myoglobin, poc: 161
Operator id: 146091
Troponin i, poc: <0.05

## 2011-04-14 LAB — BASIC METABOLIC PANEL
BUN: 10
Calcium: 8.9
GFR calc non Af Amer: 43 — ABNORMAL LOW
Glucose, Bld: 95
Potassium: 3.8
Sodium: 141

## 2011-04-14 LAB — COMPREHENSIVE METABOLIC PANEL
Albumin: 3.1 — ABNORMAL LOW
Albumin: 4.2
Alkaline Phosphatase: 111
Alkaline Phosphatase: 85
BUN: 21
BUN: 21
CO2: 26
Calcium: 10.2
Chloride: 102
Creatinine, Ser: 1.43 — ABNORMAL HIGH
GFR calc non Af Amer: 38 — ABNORMAL LOW
Glucose, Bld: 78
Glucose, Bld: 98
Potassium: 3.3 — ABNORMAL LOW
Potassium: 3.5
Sodium: 136
Total Bilirubin: 0.6
Total Protein: 8.3

## 2011-04-14 LAB — URINALYSIS, ROUTINE W REFLEX MICROSCOPIC
Glucose, UA: NEGATIVE
Hgb urine dipstick: NEGATIVE
Specific Gravity, Urine: 1.015

## 2011-04-14 LAB — POCT I-STAT, CHEM 8
BUN: 23
Chloride: 98
HCT: 51 — ABNORMAL HIGH
Potassium: 3.7
Sodium: 133 — ABNORMAL LOW

## 2011-04-14 LAB — URINE MICROSCOPIC-ADD ON

## 2011-04-14 LAB — TROPONIN I: Troponin I: 0.02

## 2011-04-14 LAB — HEMOGLOBIN A1C: Hgb A1c MFr Bld: 5.6

## 2011-04-14 LAB — CK TOTAL AND CKMB (NOT AT ARMC)
CK, MB: 1.4
Relative Index: INVALID
Total CK: 76

## 2011-04-14 LAB — D-DIMER, QUANTITATIVE: D-Dimer, Quant: 0.22

## 2011-04-14 LAB — LIPID PANEL
LDL Cholesterol: 72
Triglycerides: 153 — ABNORMAL HIGH
VLDL: 31

## 2011-04-14 LAB — TSH: TSH: 1.759

## 2011-06-16 ENCOUNTER — Ambulatory Visit: Payer: Medicare Other | Admitting: Internal Medicine

## 2011-07-28 ENCOUNTER — Ambulatory Visit: Payer: Medicare Other | Admitting: Internal Medicine

## 2011-08-04 ENCOUNTER — Ambulatory Visit: Payer: Medicare Other | Admitting: Internal Medicine

## 2011-08-25 ENCOUNTER — Ambulatory Visit: Payer: Medicare Other | Admitting: Internal Medicine

## 2013-08-14 ENCOUNTER — Telehealth: Payer: Self-pay | Admitting: Internal Medicine

## 2013-08-14 NOTE — Telephone Encounter (Signed)
Bonita [facility]Linda [facility] stated [name] pt was in hospital. Bonita QuinLinda just to to inform Dr. Jonny RuizJohn this information.

## 2013-08-22 ENCOUNTER — Ambulatory Visit (INDEPENDENT_AMBULATORY_CARE_PROVIDER_SITE_OTHER): Payer: Medicare Other | Admitting: Internal Medicine

## 2013-08-22 ENCOUNTER — Encounter: Payer: Self-pay | Admitting: Internal Medicine

## 2013-08-22 VITALS — BP 150/90 | HR 66 | Temp 97.0°F | Ht 67.0 in | Wt 126.5 lb

## 2013-08-22 DIAGNOSIS — I6350 Cerebral infarction due to unspecified occlusion or stenosis of unspecified cerebral artery: Secondary | ICD-10-CM

## 2013-08-22 DIAGNOSIS — I10 Essential (primary) hypertension: Secondary | ICD-10-CM

## 2013-08-22 DIAGNOSIS — F329 Major depressive disorder, single episode, unspecified: Secondary | ICD-10-CM

## 2013-08-22 DIAGNOSIS — E785 Hyperlipidemia, unspecified: Secondary | ICD-10-CM

## 2013-08-22 DIAGNOSIS — F3289 Other specified depressive episodes: Secondary | ICD-10-CM

## 2013-08-22 DIAGNOSIS — I639 Cerebral infarction, unspecified: Secondary | ICD-10-CM | POA: Insufficient documentation

## 2013-08-22 MED ORDER — ATORVASTATIN CALCIUM 80 MG PO TABS: 80.0000 mg | ORAL_TABLET | Freq: Every day | ORAL | Status: DC

## 2013-08-22 NOTE — Assessment & Plan Note (Signed)
stable overall by history and exam, recent data reviewed with pt, and pt to continue medical treatment as before,  to f/u any worsening symptoms or concerns BP Readings from Last 3 Encounters:  08/22/13 150/90  12/03/10 140/102  06/03/10 110/84   Lack of funds is an obstacle to best control

## 2013-08-22 NOTE — Assessment & Plan Note (Signed)
stable overall by history and exam,  and pt to continue medical treatment as before,  to f/u any worsening symptoms or concerns; but also needs Lipitor post stroke and for hyperlipidemia - I filled out pt assist program form for Lipitor, if not approved will need other statin

## 2013-08-22 NOTE — Assessment & Plan Note (Signed)
For statin as above,f/u labs next visit

## 2013-08-22 NOTE — Patient Instructions (Signed)
Please continue all other medications as before, and refills have been done if requested. Please have the pharmacy call with any other refills you may need.  Your form for the Lipitor patient assist program was filled out  Please keep your appointments with your specialists as you may have planned  Please return in 3 months, or sooner if needed

## 2013-08-22 NOTE — Progress Notes (Signed)
Subjective:    Patient ID: Yolanda [name], female    DOB: 04/30/1956, 58 y.o.   MRN: [id number]  HPI  Here to f/u after last seen may 2012;  Had recent cva jan 11 - taken per EMS to [facility] (instead of [facility]) per pt request, no Tpa since woke up with symtpoms not sure how long had, now with persistent RUE weakness that has some improved, but severe RLE weakness. Was on plavix before stroke but had stopped her clonidine due to lack of funds, now back on all meds including no change in the plavix.  Also on new lipitor rx but not taking due to cost, but possibly qualifies for pt assist program with [employer].  Pt denies other new neurological symptoms such as new headache, or new facial or extremity weakness or numbness.  Pt denies fever, wt loss, night sweats, loss of appetite, or other constitutional symptoms  Denies worsening depressive symptoms, suicidal ideation, or panic;  Past Medical History  Diagnosis Date  . B12 DEFICIENCY 12/01/2009  . VITAMIN D DEFICIENCY 06/03/2010  . HYPERLIPIDEMIA 10/25/2007  . ANXIETY 10/25/2007  . DEPRESSION 10/25/2007  . HEMIPARESIS, LEFT 11/23/2009  . Unspecified visual loss 11/23/2009  . MITRAL INSUFFICIENCY 10/25/2007  . HYPERTENSION 10/25/2007  . BRADYCARDIA 11/19/2009  . PERIPHERAL VASCULAR DISEASE 10/25/2007  . ALLERGIC RHINITIS 10/25/2007  . COPD 10/25/2007  . GERD 10/25/2007  . BACK PAIN 10/25/2007  . OSTEOPOROSIS 10/25/2007  . OSTEOPENIA 11/19/2009  . INSOMNIA-SLEEP DISORDER-UNSPEC 10/25/2007  . FATIGUE 10/25/2007  . Headache(784.0) 10/25/2007  . CHEST PAIN 12/31/2007  . CEREBROVASCULAR ACCIDENT, HX OF 10/25/2007   Past Surgical History  Procedure Laterality Date  . Cholecystectomy    . Tubal ligation      reports that she has been smoking.  She does not have any smokeless tobacco history on file. She reports that she drinks alcohol. She reports that she does not use illicit drugs. family history includes Alcohol abuse in her other; Arthritis in her other; Depression in her  other; Heart disease in her other; Hypertension in her other; Stroke in her other. Allergies  Allergen Reactions  . Ace Inhibitors    Current Outpatient Prescriptions on File Prior to Visit  Medication Sig Dispense Refill  . amLODipine (NORVASC) 10 MG tablet Take 1 tablet (10 mg total) by mouth daily.  90 tablet  3  . Calcium Carbonate (CALCIUM 500 PO) Take by mouth 2 (two) times daily.        . cholecalciferol (VITAMIN D) 1000 UNITS tablet Take 1,000 Units by mouth daily.        . clopidogrel (PLAVIX) 75 MG tablet Take 1 tablet (75 mg total) by mouth daily.  90 tablet  3  . ferrous sulfate 325 (65 FE) MG tablet Take 325 mg by mouth. 1 by mouth daily for 3 months       . Multiple Vitamin (MULTIVITAMIN) tablet Take 1 tablet by mouth daily.        Marland Kitchen. spironolactone-hydrochlorothiazide (ALDACTAZIDE) 25-25 MG per tablet Take 1 tablet by mouth daily.  90 tablet  3   No current facility-administered medications on file prior to visit.   Review of Systems  Constitutional: Negative for unexpected weight change, or unusual diaphoresis  HENT: Negative for tinnitus.   Eyes: Negative for photophobia and visual disturbance.  Respiratory: Negative for choking and stridor.   Gastrointestinal: Negative for vomiting and blood in stool.  Genitourinary: Negative for hematuria and decreased urine volume.  Musculoskeletal:  Negative for acute joint swelling Skin: Negative for color change and wound.  Neurological: Negative for tremors and numbness other than noted  Psychiatric/Behavioral: Negative for decreased concentration or  hyperactivity.       Objective:   Physical Exam BP 150/90  Pulse 66  Temp(Src) 97 F (36.1 C) (Oral)  Ht 5\' 7"  (1.702 m)  Wt 126 lb 8 oz (57.38 kg)  BMI 19.81 kg/m2  SpO2 98% VS noted,  Constitutional: Pt appears well-developed and well-nourished.  HENT: Head: NCAT.  Right Ear: External ear normal.  Left Ear: External ear normal.  Eyes: Conjunctivae and EOM are normal.  Pupils are equal, round, and reactive to light.  Neck: Normal range of motion. Neck supple.  Cardiovascular: Normal rate and regular rhythm.   Pulmonary/Chest: Effort normal and breath sounds normal.  Abd:  Soft, NT, non-distended, + BS Neurological: Pt is alert. Not confused , motor 4+/5 bilat UEs, 3+ RLE Skin: Skin is warm. No erythema.  Psychiatric: Pt behavior is normal. Thought content normal. not epressed affect    Assessment & Plan:

## 2013-08-22 NOTE — Progress Notes (Signed)
Pre-visit discussion using our clinic review tool. No additional management support is needed unless otherwise documented below in the visit note.  

## 2013-08-22 NOTE — Assessment & Plan Note (Signed)
stable overall by history and exam, , and pt to continue medical treatment as before,  to f/u any worsening symptoms or concerns  

## 2013-09-06 ENCOUNTER — Telehealth: Payer: Self-pay | Admitting: *Deleted

## 2013-09-06 NOTE — Telephone Encounter (Signed)
Phoned patient and relayed MD's response & recommendations.  Patient stated her h/a's worsened when she laid down (increasing ICP).  Consulted with PCP, who opted to recommend erring on the side of caution & advising patient to go to ED.  Relayed MD recommendation to patient, who further added her h/a's were worse when she took her trazodone, which when she didn't take it, her h/a's weren't as bad/non-existent.  Advised patient not to take anymore, but that if her sxs persisted (like the ones before her previous CVA) to go to the emergency room, as we weren't equipped to perform diagnostic imaging in the office.  Pt stated at this time, she felt like she was okay (as far as not going to ED at this time), but if sxs persisted, she would [facility] to either [facility] facility [facility]

## 2013-09-06 NOTE — Telephone Encounter (Signed)
Deanna ArtisKeisha, RN with [employer] phoned to inform PCP that patient was c/o new onset of headaches in the occipital lobe (known history of bleeds); is taking Tylenol, which is effective, but her h/a's are becoming more frequent & more intense.  Please advise if any new orders needed.  CB# [phone] (or [phone])

## 2013-09-06 NOTE — Telephone Encounter (Signed)
Needs OV, or if having pain now, should consider going to ER and bleeding in the brain is a type of stroke and can only be evaluated and treated there

## 2013-11-08 DIAGNOSIS — Z0279 Encounter for issue of other medical certificate: Secondary | ICD-10-CM

## 2013-11-11 ENCOUNTER — Telehealth: Payer: Self-pay | Admitting: *Deleted

## 2013-11-11 ENCOUNTER — Telehealth: Payer: Self-pay | Admitting: Internal Medicine

## 2013-11-11 NOTE — Telephone Encounter (Signed)
Pt is calling to see when her Xanax rx will be ready for pick-up. Please call when ready.

## 2013-11-11 NOTE — Telephone Encounter (Signed)
I dont think would be approp based on her last visit feb 2015

## 2013-11-11 NOTE — Telephone Encounter (Signed)
[facility]anda [name] from [facility] called requesting VO to evaluate and admit pt if it is appropriate.  Please advise

## 2013-11-12 NOTE — Telephone Encounter (Signed)
Spoke with Yolanda [name] advised of MDs message.  He states pts daughter called him requesting additional aid for her mother due to [facility] no longer being able to assist pt.  He further states pt has had 2 CVAs with deficits.  Please advise

## 2013-11-12 NOTE — Telephone Encounter (Signed)
Ok for palliative care - verbal ok

## 2013-11-12 NOTE — Telephone Encounter (Signed)
I cannot say pt has less than 6 mo life expectancy

## 2013-11-12 NOTE — Telephone Encounter (Signed)
Spoke with Denyse AmassCorey again, he states its for Palliative care only not Hospice care to add additional support to pt.

## 2013-11-12 NOTE — Telephone Encounter (Signed)
I am surprised at her request, as this is not a chronic medication on her list, even in the past few yrs.  I cant see where she has been rx this in the past few yrs.  Please consider OV as this is a controlled substance

## 2013-11-13 NOTE — Telephone Encounter (Signed)
Spoke with Denyse Amassorey advised of MDs message

## 2013-11-22 ENCOUNTER — Ambulatory Visit: Payer: Medicare Other | Admitting: Internal Medicine

## 2013-12-04 ENCOUNTER — Ambulatory Visit: Payer: Medicare Other | Admitting: Internal Medicine

## 2013-12-06 ENCOUNTER — Other Ambulatory Visit (INDEPENDENT_AMBULATORY_CARE_PROVIDER_SITE_OTHER): Payer: Medicare Other

## 2013-12-06 ENCOUNTER — Ambulatory Visit (INDEPENDENT_AMBULATORY_CARE_PROVIDER_SITE_OTHER): Payer: Medicare Other | Admitting: Internal Medicine

## 2013-12-06 ENCOUNTER — Encounter: Payer: Self-pay | Admitting: Internal Medicine

## 2013-12-06 VITALS — BP 150/100 | HR 58 | Temp 97.2°F | Wt 136.5 lb

## 2013-12-06 DIAGNOSIS — Z23 Encounter for immunization: Secondary | ICD-10-CM

## 2013-12-06 DIAGNOSIS — E785 Hyperlipidemia, unspecified: Secondary | ICD-10-CM

## 2013-12-06 DIAGNOSIS — J449 Chronic obstructive pulmonary disease, unspecified: Secondary | ICD-10-CM

## 2013-12-06 DIAGNOSIS — I6350 Cerebral infarction due to unspecified occlusion or stenosis of unspecified cerebral artery: Secondary | ICD-10-CM

## 2013-12-06 DIAGNOSIS — I10 Essential (primary) hypertension: Secondary | ICD-10-CM

## 2013-12-06 LAB — BASIC METABOLIC PANEL
BUN: 14 mg/dL (ref 6–23)
CHLORIDE: 100 meq/L (ref 96–112)
CO2: 28 mEq/L (ref 19–32)
Calcium: 9.6 mg/dL (ref 8.4–10.5)
Creatinine, Ser: 1.1 mg/dL (ref 0.4–1.2)
GFR: 67.22 mL/min (ref >60.00)
Glucose, Bld: 81 mg/dL (ref 70–99)
Potassium: 3.4 mEq/L — ABNORMAL LOW (ref 3.5–5.1)
Sodium: 138 mEq/L (ref 135–145)

## 2013-12-06 LAB — LIPID PANEL
CHOLESTEROL: 217 mg/dL — AB (ref 0–200)
HDL: 86.2 mg/dL (ref >39.00)
LDL Cholesterol: 116 mg/dL — ABNORMAL HIGH (ref 0–99)
Total CHOL/HDL Ratio: 3
Triglycerides: 74 mg/dL (ref 0.0–149.0)
VLDL: 14.8 mg/dL (ref 0.0–40.0)

## 2013-12-06 LAB — CBC WITH DIFFERENTIAL/PLATELET
Basophils Absolute: 0 10*3/uL (ref 0.0–0.1)
Basophils Relative: 0.5 % (ref 0.0–3.0)
Eosinophils Absolute: 0.1 10*3/uL (ref 0.0–0.7)
Eosinophils Relative: 1.4 % (ref 0.0–5.0)
HEMATOCRIT: 45.1 % (ref 36.0–46.0)
Hemoglobin: 15.1 g/dL — ABNORMAL HIGH (ref 12.0–15.0)
LYMPHS ABS: 1.1 10*3/uL (ref 0.7–4.0)
Lymphocytes Relative: 24.2 % (ref 12.0–46.0)
MCHC: 33.5 g/dL (ref 30.0–36.0)
MCV: 85.9 fl (ref 78.0–100.0)
Monocytes Absolute: 0.3 10*3/uL (ref 0.1–1.0)
Monocytes Relative: 7 % (ref 3.0–12.0)
NEUTROS ABS: 2.9 10*3/uL (ref 1.4–7.7)
Neutrophils Relative %: 66.9 % (ref 43.0–77.0)
PLATELETS: 198 10*3/uL (ref 150.0–400.0)
RBC: 5.26 Mil/uL — ABNORMAL HIGH (ref 3.87–5.11)
RDW: 14.9 % (ref 11.5–15.5)
WBC: 4.4 10*3/uL (ref 4.0–10.5)

## 2013-12-06 LAB — HEPATIC FUNCTION PANEL
ALBUMIN: 3.9 g/dL (ref 3.5–5.2)
ALT: 13 U/L (ref 0–35)
AST: 19 U/L (ref 0–37)
Alkaline Phosphatase: 87 U/L (ref 39–117)
Bilirubin, Direct: 0.1 mg/dL (ref 0.0–0.3)
Total Bilirubin: 0.7 mg/dL (ref 0.2–1.2)
Total Protein: 7.5 g/dL (ref 6.0–8.3)

## 2013-12-06 LAB — TSH: TSH: 0.7 u[IU]/mL (ref 0.35–4.50)

## 2013-12-06 MED ORDER — AMLODIPINE BESYLATE 10 MG PO TABS: 10.0000 mg | ORAL_TABLET | Freq: Every day | ORAL | Status: DC

## 2013-12-06 MED ORDER — SPIRONOLACTONE-HCTZ 25-25 MG PO TABS: 1.0000 | ORAL_TABLET | Freq: Every day | ORAL | Status: DC

## 2013-12-06 MED ORDER — LOVASTATIN 20 MG PO TABS: 20.0000 mg | ORAL_TABLET | Freq: Every day | ORAL | Status: DC

## 2013-12-06 MED ORDER — LOVASTATIN 20 MG PO TABS: ORAL_TABLET | ORAL | Status: DC

## 2013-12-06 MED ORDER — CLOPIDOGREL BISULFATE 75 MG PO TABS: 75.0000 mg | ORAL_TABLET | Freq: Every day | ORAL | Status: DC

## 2013-12-06 NOTE — Progress Notes (Signed)
Pre visit review using our clinic review tool, if applicable. No additional management support is needed unless otherwise documented below in the visit note. 

## 2013-12-06 NOTE — Progress Notes (Signed)
Subjective:    Patient ID: Yolanda [name], female    DOB: 27-Dec-1955, 58 y.o.   MRN: [id number]  HPI  Here to f/u; overall doing ok,  Pt denies chest pain, increased sob or doe, wheezing, orthopnea, PND, increased LE swelling, palpitations, dizziness or syncope.  Pt denies polydipsia, polyuria, or low sugar symptoms such as weakness or confusion improved with po intake.  Pt denies new neurological symptoms such as new headache, or facial or extremity weakness or numbness.   Pt states overall good compliance with meds, has been trying to follow lower cholesterol diet, with wt overall stable,  but little exercise however.  Out of meds for a few days, finances difficult, Due for prevnar. Plans to call soon about her mammogram.  Past Medical History  Diagnosis Date  . B12 DEFICIENCY 12/01/2009  . VITAMIN D DEFICIENCY 06/03/2010  . HYPERLIPIDEMIA 10/25/2007  . ANXIETY 10/25/2007  . DEPRESSION 10/25/2007  . HEMIPARESIS, LEFT 11/23/2009  . Unspecified visual loss 11/23/2009  . MITRAL INSUFFICIENCY 10/25/2007  . HYPERTENSION 10/25/2007  . BRADYCARDIA 11/19/2009  . PERIPHERAL VASCULAR DISEASE 10/25/2007  . ALLERGIC RHINITIS 10/25/2007  . COPD 10/25/2007  . GERD 10/25/2007  . BACK PAIN 10/25/2007  . OSTEOPOROSIS 10/25/2007  . OSTEOPENIA 11/19/2009  . INSOMNIA-SLEEP DISORDER-UNSPEC 10/25/2007  . FATIGUE 10/25/2007  . Headache(784.0) 10/25/2007  . CHEST PAIN 12/31/2007  . CEREBROVASCULAR ACCIDENT, HX OF 10/25/2007   Past Surgical History  Procedure Laterality Date  . Cholecystectomy    . Tubal ligation      reports that she has been smoking.  She does not have any smokeless tobacco history on file. She reports that she drinks alcohol. She reports that she does not use illicit drugs. family history includes Alcohol abuse in her other; Arthritis in her other; Depression in her other; Heart disease in her other; Hypertension in her other; Stroke in her other. Allergies  Allergen Reactions  . Ace Inhibitors    Current Outpatient  Prescriptions on File Prior to Visit  Medication Sig Dispense Refill  . Calcium Carbonate (CALCIUM 500 PO) Take by mouth 2 (two) times daily.        . cholecalciferol (VITAMIN D) 1000 UNITS tablet Take 1,000 Units by mouth daily.        . ferrous sulfate 325 (65 FE) MG tablet Take 325 mg by mouth. 1 by mouth daily for 3 months       . Multiple Vitamin (MULTIVITAMIN) tablet Take 1 tablet by mouth daily.        . traZODone (DESYREL) 100 MG tablet Take 100 mg by mouth at bedtime.       No current facility-administered medications on file prior to visit.   Review of Systems  Constitutional: Negative for unusual diaphoresis or other sweats  HENT: Negative for ringing in ear Eyes: Negative for double vision or worsening visual disturbance.  Respiratory: Negative for choking and stridor.   Gastrointestinal: Negative for vomiting or other signifcant bowel change Genitourinary: Negative for hematuria or decreased urine volume.  Musculoskeletal: Negative for other MSK pain or swelling Skin: Negative for color change and worsening wound.  Neurological: Negative for tremors and numbness other than noted  Psychiatric/Behavioral: Negative for decreased concentration or agitation other than above       Objective:   Physical Exam BP 150/100  Pulse 58  Temp(Src) 97.2 F (36.2 C) (Oral)  Wt 136 lb 8 oz (61.916 kg)  SpO2 98% VS noted,  Constitutional: Pt appears well-developed, well-nourished.  HENT: Head: NCAT.  Right Ear: External ear normal.  Left Ear: External ear normal.  Eyes: . Pupils are equal, round, and reactive to light. Conjunctivae and EOM are normal Neck: Normal range of motion. Neck supple.  Cardiovascular: Normal rate and regular rhythm.   Pulmonary/Chest: Effort normal and breath sounds decreased, no rales or wheezing.  Abd:  Soft, NT, ND, + BS Neurological: Pt is alert. Not confused , o/w not done in detail Skin: Skin is warm. No rash Psychiatric: Pt behavior is normal. No  agitation.     Assessment & Plan:

## 2013-12-06 NOTE — Patient Instructions (Addendum)
You had the pneumovax shot today  Please continue all other medications as before, and refills have been done if requested. Please have the pharmacy call with any other refills you may need.  Please continue your efforts at being more active, low cholesterol diet, and weight control.  Please call about your yearly mammogram  Please go to the LAB in the Basement (turn left off the elevator) for the tests to be done today  You will be contacted by phone if any changes need to be made immediately.  Otherwise, you will receive a letter about your results with an explanation, but please check with MyChart first.  Please return in 6 months, or sooner if needed

## 2013-12-09 NOTE — Assessment & Plan Note (Signed)
stable overall by history and exam, recent data reviewed with pt, and pt to continue medical treatment as before,  to f/u any worsening symptoms or concerns Lab Results  Component Value Date   LDLCALC 116* 12/06/2013   For lower chol diet, re-start all meds, including lipitor

## 2013-12-09 NOTE — Assessment & Plan Note (Signed)
stable overall by history and exam, recent data reviewed with pt, and pt to continue medical treatment as before,  to f/u any worsening symptoms or concerns SpO2 Readings from Last 3 Encounters:  12/06/13 98%  08/22/13 98%  12/03/10 98%

## 2013-12-09 NOTE — Assessment & Plan Note (Signed)
stable overall by history and exam, recent data reviewed with pt, and pt to restart meds, continue medical treatment as before,  to f/u any worsening symptoms or concerns BP Readings from Last 3 Encounters:  12/06/13 150/100  08/22/13 150/90  12/03/10 140/102

## 2013-12-10 ENCOUNTER — Telehealth: Payer: Self-pay | Admitting: Internal Medicine

## 2013-12-10 NOTE — Telephone Encounter (Signed)
Relevant patient education mailed to patient.  

## 2013-12-11 ENCOUNTER — Encounter: Payer: Self-pay | Admitting: Internal Medicine

## 2013-12-11 ENCOUNTER — Telehealth: Payer: Self-pay | Admitting: Internal Medicine

## 2013-12-11 MED ORDER — LOVASTATIN 20 MG PO TABS: ORAL_TABLET | ORAL | Status: DC

## 2013-12-11 NOTE — Telephone Encounter (Signed)
Patient has been informed of increase.

## 2013-12-11 NOTE — Telephone Encounter (Signed)
To robin  - despite earlier note, pt needs to go to 60 mg lovastatin (3 of the 20 mg tabs) - done erx

## 2013-12-20 ENCOUNTER — Telehealth: Payer: Self-pay | Admitting: Internal Medicine

## 2013-12-20 NOTE — Telephone Encounter (Signed)
I just dont know if this approp b/c of the legal aspects.  I would have to say no for the letter for now

## 2013-12-20 NOTE — Telephone Encounter (Signed)
Patient's daughter, Denver Faster is requesting a letter that the patient relies on her for transportation to scheduled doctors visits and for errands.  Patient's daughter states that she has a "restricted license" and needs this letter in order to continuee transporting the patient. Please advise if okay.

## 2013-12-20 NOTE — Telephone Encounter (Signed)
Called informed the patient of MD's response for request.

## 2014-06-06 ENCOUNTER — Ambulatory Visit: Payer: Medicare Other | Admitting: Internal Medicine

## 2014-06-11 ENCOUNTER — Ambulatory Visit (INDEPENDENT_AMBULATORY_CARE_PROVIDER_SITE_OTHER): Payer: Medicare Other | Admitting: Internal Medicine

## 2014-06-11 ENCOUNTER — Other Ambulatory Visit (INDEPENDENT_AMBULATORY_CARE_PROVIDER_SITE_OTHER): Payer: Medicare Other

## 2014-06-11 ENCOUNTER — Other Ambulatory Visit: Payer: Self-pay | Admitting: Internal Medicine

## 2014-06-11 ENCOUNTER — Encounter: Payer: Self-pay | Admitting: Internal Medicine

## 2014-06-11 VITALS — BP 112/80 | HR 62 | Temp 98.2°F | Ht 67.0 in | Wt 137.5 lb

## 2014-06-11 DIAGNOSIS — E785 Hyperlipidemia, unspecified: Secondary | ICD-10-CM

## 2014-06-11 DIAGNOSIS — I639 Cerebral infarction, unspecified: Secondary | ICD-10-CM

## 2014-06-11 DIAGNOSIS — Z8673 Personal history of transient ischemic attack (TIA), and cerebral infarction without residual deficits: Secondary | ICD-10-CM

## 2014-06-11 DIAGNOSIS — G4700 Insomnia, unspecified: Secondary | ICD-10-CM

## 2014-06-11 DIAGNOSIS — J438 Other emphysema: Secondary | ICD-10-CM

## 2014-06-11 LAB — LIPID PANEL
CHOL/HDL RATIO: 3
Cholesterol: 184 mg/dL (ref 0–200)
HDL: 73.5 mg/dL (ref >39.00)
LDL CALC: 95 mg/dL (ref 0–99)
NONHDL: 110.5
TRIGLYCERIDES: 76 mg/dL (ref 0.0–149.0)
VLDL: 15.2 mg/dL (ref 0.0–40.0)

## 2014-06-11 LAB — HEPATIC FUNCTION PANEL
ALT: 13 U/L (ref 0–35)
AST: 19 U/L (ref 0–37)
Albumin: 4.2 g/dL (ref 3.5–5.2)
Alkaline Phosphatase: 97 U/L (ref 39–117)
Bilirubin, Direct: 0.1 mg/dL (ref 0.0–0.3)
Total Bilirubin: 0.7 mg/dL (ref 0.2–1.2)
Total Protein: 7.8 g/dL (ref 6.0–8.3)

## 2014-06-11 MED ORDER — SPIRONOLACTONE-HCTZ 25-25 MG PO TABS: 1.0000 | ORAL_TABLET | Freq: Every day | ORAL | Status: DC

## 2014-06-11 MED ORDER — TRAZODONE HCL 100 MG PO TABS: 100.0000 mg | ORAL_TABLET | Freq: Every evening | ORAL | Status: DC | PRN

## 2014-06-11 MED ORDER — LOVASTATIN 20 MG PO TABS: ORAL_TABLET | ORAL | Status: DC

## 2014-06-11 NOTE — Assessment & Plan Note (Signed)
Stable, pt asking for Pt assist program and rx for trazadone,  to f/u any worsening symptoms or concerns

## 2014-06-11 NOTE — Assessment & Plan Note (Signed)
stable overall by history and exam, recent data reviewed with pt, and pt to continue medical treatment as before,  to f/u any worsening symptoms or concerns, for f/u lipids today with recent increased statin Lab Results  Component Value Date   LDLCALC 116* 12/06/2013

## 2014-06-11 NOTE — Assessment & Plan Note (Signed)
stable overall by history and exam, recent data reviewed with pt, and pt to continue medical treatment as before,  to f/u any worsening symptoms or concerns , cont current support and asa (now off plavix with hx of brain aneurysm x 2)

## 2014-06-11 NOTE — Progress Notes (Signed)
Pre visit review using our clinic review tool, if applicable. No additional management support is needed unless otherwise documented below in the visit note. 

## 2014-06-11 NOTE — Addendum Note (Signed)
Addended by: Corwin LevinsJOHN, JAMES W on: 06/11/2014 10:07 AM   Modules accepted: Orders

## 2014-06-11 NOTE — Assessment & Plan Note (Signed)
stable overall by history and exam, recent data reviewed with pt, and pt to continue medical treatment as before,  to f/u any worsening symptoms or concerns SpO2 Readings from Last 3 Encounters:  06/11/14 97%  12/06/13 98%  08/22/13 98%

## 2014-06-11 NOTE — Patient Instructions (Signed)

## 2014-06-11 NOTE — Progress Notes (Signed)
Subjective:    Patient ID: Yolanda [name], female    DOB: 05/30/1956, 58 y.o.   MRN: [id number]  HPI  Here to f/u, seeing neurosurgury at [facility]/Dr dewbroski with recent CT scan for aneursym f/u - I was able to related to pt the results per care everywhere since she has not heard from their office - no change in several atherosclerotic deposits in the cerebral circ, and no change in size of 2 small right side intracranial anuerysm. Still with signficant 100% disabled due to 2004 stroke related left side weakness, then 2015 stroke related right sided weakness. Needs FMLA form filled out., Pt denies new neurological symptoms such as new headache, or facial or extremity weakness or numbness.  Pt denies chest pain, increased sob or doe, wheezing, orthopnea, PND, increased LE swelling, palpitations, dizziness or syncope.   Pt denies polydipsia, polyuria, Tolerating statin.  Does have bilat LE weakness strokes x 2 related, but not worsening statin related it seems. Does not currently have home health, has been through recent PT. Has walker at home, using cane today b/c easier. Lives alone and tries to be independent as possible, but duaghter helps her daily with ADL's including dressing, bathing, toileting , cooking and cleaning,; pt can feed herself however.  Past Medical History  Diagnosis Date  . B12 DEFICIENCY 12/01/2009  . VITAMIN D DEFICIENCY 06/03/2010  . HYPERLIPIDEMIA 10/25/2007  . ANXIETY 10/25/2007  . DEPRESSION 10/25/2007  . HEMIPARESIS, LEFT 11/23/2009  . Unspecified visual loss 11/23/2009  . MITRAL INSUFFICIENCY 10/25/2007  . HYPERTENSION 10/25/2007  . BRADYCARDIA 11/19/2009  . PERIPHERAL VASCULAR DISEASE 10/25/2007  . ALLERGIC RHINITIS 10/25/2007  . COPD 10/25/2007  . GERD 10/25/2007  . BACK PAIN 10/25/2007  . OSTEOPOROSIS 10/25/2007  . OSTEOPENIA 11/19/2009  . INSOMNIA-SLEEP DISORDER-UNSPEC 10/25/2007  . FATIGUE 10/25/2007  . Headache(784.0) 10/25/2007  . CHEST PAIN 12/31/2007  . CEREBROVASCULAR ACCIDENT, HX OF  10/25/2007   Past Surgical History  Procedure Laterality Date  . Cholecystectomy    . Tubal ligation      reports that she has been smoking.  She does not have any smokeless tobacco history on file. She reports that she drinks alcohol. She reports that she does not use illicit drugs. family history includes Alcohol abuse in her other; Arthritis in her other; Depression in her other; Heart disease in her other; Hypertension in her other; Stroke in her other. Allergies  Allergen Reactions  . Ace Inhibitors    Current Outpatient Prescriptions on File Prior to Visit  Medication Sig Dispense Refill  . amLODipine (NORVASC) 10 MG tablet Take 1 tablet (10 mg total) by mouth daily. 90 tablet 3  . Calcium Carbonate (CALCIUM 500 PO) Take by mouth 2 (two) times daily.      . cholecalciferol (VITAMIN D) 1000 UNITS tablet Take 1,000 Units by mouth daily.      . clopidogrel (PLAVIX) 75 MG tablet Take 1 tablet (75 mg total) by mouth daily. 90 tablet 3  . ferrous sulfate 325 (65 FE) MG tablet Take 325 mg by mouth. 1 by mouth daily for 3 months     . Multiple Vitamin (MULTIVITAMIN) tablet Take 1 tablet by mouth daily.      Marland Kitchen. spironolactone-hydrochlorothiazide (ALDACTAZIDE) 25-25 MG per tablet Take 1 tablet by mouth daily. 90 tablet 3  . traZODone (DESYREL) 100 MG tablet Take 100 mg by mouth at bedtime.     No current facility-administered medications on file prior to visit.   Review of  Systems  Constitutional: Negative for unusual diaphoresis or other sweats  HENT: Negative for ringing in ear Eyes: Negative for double vision or worsening visual disturbance.  Respiratory: Negative for choking and stridor.   Gastrointestinal: Negative for vomiting or other signifcant bowel change Genitourinary: Negative for hematuria or decreased urine volume.  Musculoskeletal: Negative for other MSK pain or swelling Skin: Negative for color change and worsening wound.  Neurological: Negative for tremors and numbness  other than noted  Psychiatric/Behavioral: Negative for decreased concentration or agitation other than above       Objective:   Physical Exam BP 112/80 mmHg  Pulse 62  Temp(Src) 98.2 F (36.8 C) (Oral)  Ht 5\' 7"  (1.702 m)  Wt 137 lb 8 oz (62.37 kg)  BMI 21.53 kg/m2  SpO2 97% VS noted,  Constitutional: Pt appears well-developed, well-nourished.  HENT: Head: NCAT.  Right Ear: External ear normal.  Left Ear: External ear normal.  Eyes: . Pupils are equal, round, and reactive to light. Conjunctivae and EOM are normal Neck: Normal range of motion. Neck supple.  Cardiovascular: Normal rate and regular rhythm.   Pulmonary/Chest: Effort normal and breath sounds normal.  Abd:  Soft, NT, ND, + BS Neurological: Pt is alert. Not confused , motor no change Skin: Skin is warm. No rash Psychiatric: Pt behavior is normal. No agitation.     Assessment & Plan:

## 2014-08-08 ENCOUNTER — Ambulatory Visit: Payer: Medicare Other | Admitting: Internal Medicine

## 2014-09-20 ENCOUNTER — Emergency Department: Payer: Self-pay | Admitting: Emergency Medicine

## 2014-11-26 ENCOUNTER — Encounter: Payer: Self-pay | Admitting: Internal Medicine

## 2014-11-26 ENCOUNTER — Ambulatory Visit (INDEPENDENT_AMBULATORY_CARE_PROVIDER_SITE_OTHER): Payer: Medicare Other | Admitting: Internal Medicine

## 2014-11-26 VITALS — BP 114/74 | HR 62 | Temp 97.8°F | Resp 18 | Ht 67.0 in | Wt 134.1 lb

## 2014-11-26 DIAGNOSIS — G4700 Insomnia, unspecified: Secondary | ICD-10-CM | POA: Diagnosis not present

## 2014-11-26 DIAGNOSIS — E785 Hyperlipidemia, unspecified: Secondary | ICD-10-CM

## 2014-11-26 DIAGNOSIS — R413 Other amnesia: Secondary | ICD-10-CM | POA: Diagnosis not present

## 2014-11-26 DIAGNOSIS — I10 Essential (primary) hypertension: Secondary | ICD-10-CM

## 2014-11-26 MED ORDER — DONEPEZIL HCL 5 MG PO TABS: 5.0000 mg | ORAL_TABLET | Freq: Every day | ORAL | Status: DC

## 2014-11-26 MED ORDER — CLONIDINE HCL 0.2 MG PO TABS
0.2000 mg | ORAL_TABLET | Freq: Two times a day (BID) | ORAL | Status: DC
Start: 2014-11-26 — End: 2014-12-03

## 2014-11-26 NOTE — Patient Instructions (Signed)
OK to decrease the clonidine to 0.2 mg twice per day  Please take all new medication as prescribed - the aricept at 5 mg per day  Please continue all other medications as before, and refills have been done if requested.  Please have the pharmacy call with any other refills you may need  Please keep your appointments with your specialists as you may have planned  Please go to the LAB in the Basement (turn left off the elevator) for the tests to be done at your convenience  Please remember to sign up for MyChart if you have not done so, as this will be important to you in the future with finding out test results, communicating by private email, and scheduling acute appointments online when needed.  You will be contacted by phone if any changes need to be made immediately.  Otherwise, you will receive a letter about your results with an explanation, but please check with MyChart first.  Please return in 3 months, or sooner if needed

## 2014-11-26 NOTE — Assessment & Plan Note (Signed)
stable overall by history and exam, recent data reviewed with pt, and pt to continue medical treatment as before,  to f/u any worsening symptoms or concerns Lab Results  Component Value Date   LDLCALC 95 06/11/2014

## 2014-11-26 NOTE — Assessment & Plan Note (Signed)
Ok to continue current meds- ,  to f/u any worsening symptoms or concerns

## 2014-11-26 NOTE — Progress Notes (Signed)
Subjective:    Patient ID: Yolanda [name], female    DOB: 10/29/1955, 59 y.o.   MRN: [id number]  HPI    Here to fu with daughter from [place] who is very supportive but difficult to understand due to pressured speech and trying to get in an hours evaluation in the limited time available.  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 Hosp course complicated by delerium,  Has persistent difficulty sleeping on current meds, has had chronic insomnia for many years prior, - now on seroquel, trazodone and melatonin. , with also haldol prn agitation but not had to use.  Pt has been home for 6 days.  Unfortunately klonopin in the AM makes her sleepy during the dya, makes it more difficult to sleep then at night.   BP and HR have been on the lower side recently - checks BP HR at home often low 90's/48. occas 135/72, with HR documented occas in 40's.  Seems "out of it" during the day as well on high dose clonidine  Also noted to have significant ST memory issue, daughter would like to ask for aricept as well. Also asks for routine lab work, including UA since hx of UTI x 3 since mar 2016, all assoc with foley cath use. Past Medical History  Diagnosis Date  . B12 DEFICIENCY 12/01/2009  . VITAMIN D DEFICIENCY 06/03/2010  . HYPERLIPIDEMIA 10/25/2007  . ANXIETY 10/25/2007  . DEPRESSION 10/25/2007  . HEMIPARESIS, LEFT 11/23/2009  . Unspecified visual loss 11/23/2009  . MITRAL INSUFFICIENCY 10/25/2007  . HYPERTENSION 10/25/2007  . BRADYCARDIA 11/19/2009  . PERIPHERAL VASCULAR DISEASE 10/25/2007  . ALLERGIC RHINITIS 10/25/2007  . COPD 10/25/2007  . GERD 10/25/2007  . BACK PAIN 10/25/2007  . OSTEOPOROSIS 10/25/2007  . OSTEOPENIA 11/19/2009    . INSOMNIA-SLEEP DISORDER-UNSPEC 10/25/2007  . FATIGUE 10/25/2007  . Headache(784.0) 10/25/2007  . CHEST PAIN 12/31/2007  . CEREBROVASCULAR ACCIDENT, HX OF 10/25/2007   Past Surgical History  Procedure Laterality Date  . Cholecystectomy    . Tubal ligation      reports that she has been smoking.  She does not have any smokeless tobacco history on file. She reports that she drinks alcohol. She reports that she does not use illicit drugs. family history includes Alcohol abuse in her other; Arthritis in her other; Depression in her other; Heart disease in her other; Hypertension in her other; Stroke in her other. Allergies  Allergen Reactions  . Ace Inhibitors    Current Outpatient Prescriptions on File Prior to Visit  Medication Sig Dispense Refill  . amLODipine (NORVASC) 10 MG tablet Take 1 tablet (10 mg total) by mouth daily. 90 tablet 3  . Calcium Carbonate (CALCIUM 500 PO) Take by mouth 2 (two) times daily.      . cholecalciferol (VITAMIN D) 1000 UNITS tablet Take 1,000 Units by mouth daily.      . ferrous sulfate 325 (65 FE) MG tablet Take 325 mg by mouth. 1 by mouth daily for 3 months     . lovastatin (MEVACOR) 20 MG tablet 3 tabs by mouth per day 270 tablet 3  . Multiple Vitamin (MULTIVITAMIN) tablet Take 1 tablet by mouth daily.      .Marland Kitchen  traZODone (DESYREL) 100 MG tablet Take 1 tablet (100 mg total) by mouth at bedtime as needed for sleep. 90 tablet 1   No current facility-administered medications on file prior to visit.   Review of Systems  Constitutional: Negative for unusual diaphoresis or night sweats HENT: Negative for ringing in ear or discharge Eyes: Negative for double vision or worsening visual disturbance.  Respiratory: Negative for choking and stridor.   Gastrointestinal: Negative for vomiting or other signifcant bowel change Genitourinary: Negative for hematuria or change in urine volume.  Musculoskeletal: Negative for other MSK pain or swelling Skin: Negative for color  change and worsening wound.  Neurological: Negative for tremors and numbness other than noted  Psychiatric/Behavioral: Negative for decreased concentration or agitation other than above       Objective:   Physical Exam BP 114/74 mmHg  Pulse 62  Temp(Src) 97.8 F (36.6 C) (Oral)  Resp 18  Ht 5\' 7"  (1.702 m)  Wt 134 lb 1.3 oz (60.818 kg)  BMI 20.99 kg/m2  SpO2 99% VS noted,  Constitutional: Pt appears in no significant distress, alert , sitting up in wheelchair HENT: Head: NCAT. S/p VP shunt Right Ear: External ear normal.  Left Ear: External ear normal.  Eyes: . Pupils are equal, round, and reactive to light. Conjunctivae and EOM are normal Neck: Normal range of motion. Neck supple.  Cardiovascular: Normal rate and regular rhythm.   Pulmonary/Chest: Effort normal and breath sounds without rales or wheezing.  Abd:  Soft, NT, ND, + BS Neurological: Pt is alert. Not confused , motor grossly intact Skin: Skin is warm. No rash, no LE edema Psychiatric: Pt behavior is mild nervous, No agitation.      Assessment & Plan:

## 2014-11-26 NOTE — Assessment & Plan Note (Addendum)
unfort with some sedation during day, borderline low BP and occas HR's in 40's - to decrease the clonidine to 0.2 bid, cont home monitoring as they have been doing.  BP Readings from Last 3 Encounters:  11/26/14 114/74  06/11/14 112/80  12/06/13 150/100   Note:  Total time for pt hx, exam, review of record with pt in the room, determination of diagnoses and plan for further eval and tx is > 40 min, with over 50% spent in coordination and counseling of patient

## 2014-11-26 NOTE — Assessment & Plan Note (Signed)
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

## 2014-11-28 ENCOUNTER — Telehealth: Payer: Self-pay | Admitting: Internal Medicine

## 2014-11-28 NOTE — Telephone Encounter (Signed)
Notified Milli with md response...Raechel Chute/lmb

## 2014-11-28 NOTE — Telephone Encounter (Signed)
Ok for verbal 

## 2014-11-28 NOTE — Telephone Encounter (Signed)
Milli social worker [phone]  [facility]

## 2014-12-01 ENCOUNTER — Telehealth: Payer: Self-pay | Admitting: Internal Medicine

## 2014-12-01 NOTE — Telephone Encounter (Signed)
Ok for verbal 

## 2014-12-01 NOTE — Telephone Encounter (Signed)
Clydie BraunKaren is calling to request verbal orders for speech therapy. Please call her @ info below

## 2014-12-01 NOTE — Telephone Encounter (Signed)
Notified Clydie BraunKaren with md response...Raechel Chute/lmb

## 2014-12-02 ENCOUNTER — Telehealth: Payer: Self-pay | Admitting: Internal Medicine

## 2014-12-02 NOTE — Telephone Encounter (Signed)
Ok for verbal 

## 2014-12-02 NOTE — Telephone Encounter (Signed)
Esther from [facility] is requesting verbal authorization for occupational therapy. Please call her at [phone]

## 2014-12-02 NOTE — Telephone Encounter (Signed)
Dewayne Hatchnn is asking for verbal orders for pt 2 times per week for 4 weeks. Please give her a call.

## 2014-12-02 NOTE — Telephone Encounter (Signed)
Verbal ok for OT  

## 2014-12-02 NOTE — Telephone Encounter (Signed)
HHRN advised of verbal authorization for Physical Therapy

## 2014-12-02 NOTE — Telephone Encounter (Signed)
Requesting that lab orders be sent to [facility] [address], [place], [place] [address]-[phone]  Please call to advise it is sent    Additionally... They are having issues xferring scripts from pharmacy to pharmacy because there are no refills. [facility] [place] ph# [phone]  lovastatin (MEVACOR) 40 MG tablet - 2 tabs by mouth w/ dinner QUEtiapine (SEROQUEL) 25 MG tablet [[id number]][[id number]]  traZODone (DESYREL) 50 MG tablet [[id number]][[id number]] -1/2 tab by miouth @ bedtime amLODipine (NORVASC) 10 MG tablet [[id number]][[id number]]  folbee - 1 tab per day  spironolactone (ALDACTONE) 25 MG tablet [[id number]][[id number]]  pantoprazole (PROTONIX) 40 MG tablet [[id number]][[id number]]  alendronate (FOSAMAX) 70 MG tablet [[id number]][[id number]] (every tuesday) Haloperidol 5 MG - 1/2 tab every 6 hours as needed for agitation or psychosis cloNIDine (CATAPRES) 0.2 MG tablet [[id number]][[id number]]  donepezil (ARICEPT) 5 MG tablet [[id number]][[id number]]  oxycodone 5Mg - 1 every 4 hours as needed for pain

## 2014-12-03 MED ORDER — AMLODIPINE BESYLATE 10 MG PO TABS: 10.0000 mg | ORAL_TABLET | Freq: Every day | ORAL | Status: AC

## 2014-12-03 MED ORDER — LOVASTATIN 20 MG PO TABS: ORAL_TABLET | ORAL | Status: AC

## 2014-12-03 MED ORDER — CLONIDINE HCL 0.2 MG PO TABS: 0.2000 mg | ORAL_TABLET | Freq: Two times a day (BID) | ORAL | Status: DC

## 2014-12-03 MED ORDER — QUETIAPINE FUMARATE 25 MG PO TABS: 25.0000 mg | ORAL_TABLET | Freq: Every day | ORAL | Status: DC

## 2014-12-03 MED ORDER — FOLIC ACID-VIT B6-VIT B12 2.5-25-1 MG PO TABS: 1.0000 | ORAL_TABLET | Freq: Every day | ORAL | Status: DC

## 2014-12-03 MED ORDER — DONEPEZIL HCL 5 MG PO TABS: 5.0000 mg | ORAL_TABLET | Freq: Every day | ORAL | Status: DC

## 2014-12-03 MED ORDER — TRAZODONE HCL 50 MG PO TABS: 25.0000 mg | ORAL_TABLET | Freq: Every evening | ORAL | Status: DC | PRN

## 2014-12-03 MED ORDER — HALOPERIDOL 5 MG PO TABS: ORAL_TABLET | ORAL | Status: DC

## 2014-12-03 MED ORDER — SPIRONOLACTONE 25 MG PO TABS: 25.0000 mg | ORAL_TABLET | Freq: Every day | ORAL | Status: DC

## 2014-12-03 MED ORDER — OXYCODONE HCL 5 MG PO TABS: 5.0000 mg | ORAL_TABLET | Freq: Four times a day (QID) | ORAL | Status: DC | PRN

## 2014-12-03 MED ORDER — ALENDRONATE SODIUM 70 MG PO TABS: 70.0000 mg | ORAL_TABLET | ORAL | Status: DC

## 2014-12-03 MED ORDER — PANTOPRAZOLE SODIUM 40 MG PO TBEC: 40.0000 mg | DELAYED_RELEASE_TABLET | Freq: Every day | ORAL | Status: DC

## 2014-12-03 NOTE — Telephone Encounter (Signed)
Lab orders on Rx, and 3 other Rx  - [facility]

## 2014-12-03 NOTE — Telephone Encounter (Signed)
Rx for Haloperidol and Trazodone [facility], Lab order faxed to [facility] [phone], daughter advised to pick up Rx for Oxycodone at the office

## 2014-12-03 NOTE — Telephone Encounter (Signed)
Please advise on lab order and Rx refill for Trazodone, Oxycodone, and Haloperidol. These medications are not on pt's list

## 2014-12-03 NOTE — Telephone Encounter (Signed)
Yolanda [name] advised of authorization

## 2014-12-04 ENCOUNTER — Telehealth: Payer: Self-pay | Admitting: Internal Medicine

## 2014-12-04 NOTE — Telephone Encounter (Signed)
HHRN advised that verbal authorization was given 12/02/2014. RN acknowledged.

## 2014-12-04 NOTE — Telephone Encounter (Signed)
Ann from [facility] is needing a verbal authorization for physical therapy for 2 x wk for 4 wks

## 2014-12-04 NOTE — Telephone Encounter (Signed)
Patient's daughter Coralee Northemple called regarding patient's hear rate and bp since she received the shunt. It was checked 3 times today. This morning was 148/86 - hr 64, next one later on was 110/60 - hr 45 and most recent was 122/60 - hr 44. Her question would be what would be normal for because she is concerned how erratic it is. Can you please call daughter at [phone]

## 2014-12-05 MED ORDER — CLONIDINE HCL 0.1 MG PO TABS: 0.1000 mg | ORAL_TABLET | Freq: Two times a day (BID) | ORAL | Status: DC

## 2014-12-05 NOTE — Telephone Encounter (Signed)
Pt's daughter advised of MD recommendation and new Rx. She will continue to monitor pt and call the office on 05/24 with HR and BP readings

## 2014-12-05 NOTE — Telephone Encounter (Signed)
Daughter called back in requesting call back.  Would like clarification on how to take clonidine for tonight.

## 2014-12-05 NOTE — Telephone Encounter (Signed)
Daughter advised pt to take 0.1 clonidine per pt

## 2014-12-05 NOTE — Telephone Encounter (Signed)
Left message on machine for pt to return my call  

## 2014-12-05 NOTE — Telephone Encounter (Signed)
Pt's daughter is requesting specific perimeters for pt's BP and HR, please advise

## 2014-12-05 NOTE — Telephone Encounter (Signed)
Daughter can be reached at [phone]

## 2014-12-05 NOTE — Telephone Encounter (Signed)
The BP's are OK, but unfort despite decreasing the clonidine to 0.2 mg at last visit, the HR is too low  We want the HR to be generally higher than 50, lower than 100  OK to decrease the clonidine to 0.1 mg (I will send a new rx) and continue to monitor the HR and BP  Please call Tues may 24 with results

## 2014-12-10 ENCOUNTER — Ambulatory Visit: Payer: Medicare Other | Admitting: Internal Medicine

## 2014-12-11 ENCOUNTER — Telehealth: Payer: Self-pay | Admitting: Internal Medicine

## 2014-12-11 DIAGNOSIS — I69351 Hemiplegia and hemiparesis following cerebral infarction affecting right dominant side: Secondary | ICD-10-CM | POA: Diagnosis not present

## 2014-12-11 NOTE — Telephone Encounter (Signed)
Verbal authorization given per PCP's protocol

## 2014-12-11 NOTE — Telephone Encounter (Signed)
[facility] [phone] [facility]   Verbal orders OT 2 more Visit  To follow direction

## 2014-12-23 ENCOUNTER — Telehealth: Payer: Self-pay | Admitting: Internal Medicine

## 2014-12-23 NOTE — Telephone Encounter (Signed)
Pt daughter called in and has several questions about pt meds.  Please call her [phone]

## 2014-12-23 NOTE — Telephone Encounter (Signed)
Verbal authorization given via confidential VM 

## 2014-12-23 NOTE — Telephone Encounter (Signed)
Calling for verbal orders. Asking for 1 per week for 4 weeks beginning next week

## 2014-12-23 NOTE — Telephone Encounter (Signed)
Daughter had a question regarding refill of pt's Lovastatin. Call completed

## 2014-12-24 NOTE — Telephone Encounter (Signed)
Patient's other daughter Coralee Northemple has more questions. Please call her at

## 2014-12-24 NOTE — Addendum Note (Signed)
Addended by: Anselm JunglingBONYUN-DEBOURGH, Gage Weant M on: 12/24/2014 02:51 PM   Modules accepted: Orders

## 2014-12-24 NOTE — Telephone Encounter (Signed)
Pt takes 2 tablets of Lovastatin 20 mg not 3 tablet.

## 2014-12-24 NOTE — Telephone Encounter (Signed)
[phone] °

## 2014-12-26 ENCOUNTER — Telehealth: Payer: Self-pay | Admitting: Internal Medicine

## 2014-12-26 NOTE — Telephone Encounter (Signed)
Patient Name: Yolanda [name] DOB: 12/25/1955 Initial Comment Caller says her mother was given all her meds twice; including BP . Other dtr Maryelizabeth Kaufmann is also on the line; hers is the Secondary # Nurse Assessment Nurse: Yetta Barre, RN, Miranda Date/Time (Eastern Time): 12/26/2014 12:30:26 PM Confirm and document reason for call. If symptomatic, describe symptoms. ---Caller states her mother accidently received a second dose of her morning medications (Folbee, Vit C, multi Vit, Spiralactone 25 mg, Clondine 0.1mg , Pantaprazole 40mg ) . She is not having any symptoms at this time. They are concerned about giving night meds (Lovastatin, Melatonin, Calcium/Vit D3, Seroquel, Trazadone, Amlodapine, Clonadine, Aricept) Has the patient traveled out of the country within the last 30 days? ---Not Applicable Does the patient require triage? ---Yes Related visit to physician within the last 2 weeks? ---No Does the PT have any chronic conditions? (i.e. diabetes, asthma, etc.) ---Yes List chronic conditions. ---HTN, High Cholesterol, Dementia Guidelines Guideline Title Affirmed Question Affirmed Notes Poisoning [1] DOUBLE DOSE (an extra dose or lesser amount) of prescription drug AND [2] NO symptoms Final Disposition User Call PCP Now Yetta Barre, RN, Miranda CARE ADVICE given per Poisoning (Adult) guideline. CALL PCP NOW: You need to discuss this with your doctor. I'll notify him now. If you do not hear back from the doctor. Hold Clonadin tonight but continue other medications as normal.

## 2014-12-26 NOTE — Telephone Encounter (Signed)
Left message on machine with detail advisement for pt's dauhgter

## 2014-12-26 NOTE — Telephone Encounter (Signed)
Ok to hold on taking the clonidine and amlodipine tonight only, o/w to take all of rest of meds as she usually does, and then take all meds tomorrow as she normally would

## 2014-12-30 ENCOUNTER — Telehealth: Payer: Self-pay | Admitting: *Deleted

## 2014-12-30 NOTE — Telephone Encounter (Signed)
[facility] - Client TELEPHONE ADVICE RECORD [facility] Patient Name: Yolanda [name] Gender: Female DOB: 03-08-1956 Age: 59 Y 3 M 8 D Return Phone Number: [phone] (Primary), [phone] (Secondary) Address: [address] City/State/Zip: [place] [address] Client [facility] - Client Client Site [facility] Day Physician Oliver Barre Contact Type Call Call Type Triage / Clinical Caller Name [facility] Relationship To Patient Daughter Appointment Disposition EMR Appointment Not Necessary Info pasted into Epic Yes Return Phone Number Please choose phone number Chief Complaint OVERDOSE took too much medication at once Initial Comment Caller says her mother was given all her meds twice; including BP . Other dtr Maryelizabeth Kaufmann is also on the line; hers is the Secondary # PreDisposition Call Doctor Nurse Assessment Nurse: Yetta Barre, RN, Miranda Date/Time Lamount Cohen Time): 12/26/2014 12:30:26 PM Confirm and document reason for call. If symptomatic, describe symptoms. ---Caller states her mother accidently received a second dose of her morning medications (Folbee, Vit C, multi Vit, Spiralactone 25 mg, Clondine 0.1mg , Pantaprazole 40mg ) . She is not having any symptoms at this time. They are concerned about giving night meds (Lovastatin, Melatonin, Calcium/Vit D3, Seroquel, Trazadone, Amlodapine, Clonadine, Aricept) Has the patient traveled out of the country within the last 30 days? ---Not Applicable Does the patient require triage? ---Yes Related visit to physician within the last 2 weeks? ---No Does the PT have any chronic conditions? (i.e. diabetes, asthma, etc.) ---Yes List chronic conditions. ---HTN, High Cholesterol, Dementia Guidelines Guideline Title Affirmed Question Affirmed Notes Nurse Date/Time (Eastern Time) Poisoning [1] DOUBLE DOSE (an extra dose or lesser amount) of prescription drug AND [2]  NO symptoms Yetta Barre, RN, Miranda 12/26/2014 12:52:11 PM Disp. Time Lamount Cohen Time) Disposition Final User 12/26/2014 12:21:05 PM Send to Urgent York Grice, Rosie PLEASE NOTE: All timestamps contained within this report are represented as [place] Standard Time. CONFIDENTIALTY NOTICE: This fax transmission is intended only for the addressee. It contains information that is legally privileged, confidential or otherwise protected from use or disclosure. If you are not the intended recipient, you are strictly prohibited from reviewing, disclosing, copying using or disseminating any of this information or taking any action in reliance on or regarding this information. If you have received this fax in error, please notify us immediately by telephone so that we can arrange for its return to [place]. Phone: [phone], Toll-Free: [phone], Fax: [phone] Page: 2 of 2 Call Id: [id number] 12/26/2014 12:53:02 PM Call PCP Now Yes Yetta Barre, RN, Lenetta Quaker Understands: Yes Disagree/Comply: Comply Care Advice Given Per Guideline CARE ADVICE given per Poisoning (Adult) guideline. CALL PCP NOW: You need to discuss this with your doctor. I'll notify him now. If you do not hear back from the doctor. Hold Clonadin tonight but continue other medications as normal. After Care Instructions Given Call Event Type User Date / Time Description

## 2014-12-31 ENCOUNTER — Telehealth: Payer: Self-pay | Admitting: Internal Medicine

## 2014-12-31 NOTE — Telephone Encounter (Signed)
Yolanda [name] from [facility] called regarding a fax for face to face that she has sent over twice. She will be faxing again today. Start of care date is 11/24/14. Can you give her a call to let her know if you received it at [phone]

## 2015-01-01 NOTE — Telephone Encounter (Signed)
Vernona Rieger advised form received filled out and faxed back

## 2015-01-21 ENCOUNTER — Telehealth: Payer: Self-pay

## 2015-01-21 ENCOUNTER — Telehealth: Payer: Self-pay | Admitting: Internal Medicine

## 2015-01-21 NOTE — Telephone Encounter (Signed)
Message received via e-mail from daughter;  Hi my name is Yolanda [name] and I am the daughter of Yolanda [name]. This message is for Dr. Oliver BarreJames John or staff.   I need to speak w/the doctor, if at all possible, about changing or increasing the strength on mom's sleep meds.   After my sister was killed in 2002, my mother has not slept at night....at all. She "may" sleep during the day.   (In case the regular person that KNOWS mom's history does NOT get their hands on this message, please see the following)  As a result of my sister passing, in 2004, my mother had her first ischemic stroke, second ischemic stroke in 142015 and in March 2016, she had a brain bleed which has altered her memory significantly.   The [facility] neurosurgeons and also rehab put her on, and discharged her on Seroquel 25mg , one tablet, and OTC Melatonin 3mg , one tablet. They had to play with the Seroquel dosage to get the right dosage because of it doping her up WAY too much OR the strength NOT being strong enough, therefore it didn't work at all.   The latter is what is happening right now. I believe her body has gotten use to the strength she is currently on because she is no longer sleeping throughout the night. With her now having Alzheimer's/dementia AND not sleeping, it is driving my sister and I absolutely batty.  Please give me a call to discuss at [phone]. We also need to speak with Dr. Jonny RuizJohn about the memory med we all agreed to place her on, which is Aricept.   (my apologies for the extremely long message)

## 2015-01-21 NOTE — Telephone Encounter (Signed)
Pt's daughter advised via e-mail

## 2015-01-21 NOTE — Telephone Encounter (Signed)
Very sorry, I am unable to do phone consultations due to the office schedule  Getting a person to sleep at night can be very difficult in the setting of dementia  OK to increase the seroquel to 50 qhs, and trazodone to 50 qhs as well  I can increase the aricept to 10 mg as well, if this is felt needed, as she has now been on it for more than one month, or it could be changed to namenda if there is diarrhea side effect with the aricep

## 2015-01-21 NOTE — Telephone Encounter (Signed)
Patient Name: Yolanda [name] DOB: 05/26/1956 Initial Comment caller states her mother takes medication twice a day and is suppose to be 12 hrs apart - she is getting ready to give it to her - she is suppose to have it again at 8 pm - is this okay Nurse Assessment Nurse: Yetta BarreJones, RN, Miranda Date/Time (Eastern Time): 01/21/2015 11:53:17 AM Confirm and document reason for call. If symptomatic, describe symptoms. ---Caller states her mother normally takes her medications at 9am. She missed that time and did not take until 1130. The only medication she takes twice daily is Clonidine. She wants to make sure she can take her medications at her normal time tonight. Has the patient traveled out of the country within the last 30 days? ---Not Applicable Does the patient require triage? ---No Please document clinical information provided and list any resource used. ---Told caller that the medication is written as Twice a Day, which means once in the morning and once at night. They do not have to be exactly 12 hours apart. Ok to give med tonight as normal. Guidelines Guideline Title Affirmed Question Affirmed Notes Final Disposition User Clinical Call Yetta BarreJones, Charity fundraiserN, Marshall & IlsleyMiranda

## 2015-01-22 NOTE — Telephone Encounter (Signed)
There is no tablet strengths for these meds that she requests.  Unless the medications can be cut with a pill cutter, increasing the dosing as she suggests will not be practical.    If she feels the pills can be cut, I can adjust the prescriptions  O/w I am not able to discourse in a brief message about the issues she is concerned with, this would require an OV  Please ask patient to not use my business email for communication, as this is inappropriate in that is against [employer] rules, not HIPAA compliant as is not secure, and is not included in the medical record as is required by the [facility] Medical Board.  All communication should be by phone message or [employer] email. thanks

## 2015-01-22 NOTE — Telephone Encounter (Signed)
Spoke to ms poteat- scheduled for tomorrow per request

## 2015-01-22 NOTE — Telephone Encounter (Signed)
Pt's daughter's reply via e-mail:  Hi there, Dr. Jonny RuizJohn did mention to us the diarrhea side effect with increasing Aricept to 10mg , therefore we do NOT want to increase that particular drug. However, please let me do some quick research on Namenda and I will get back to you before day's end to let you know about that one.  In regards to Seroquel 50mg  and Trazodone 50 mg, when I mentioned in my previous note to you that rehab had to "play around" with the dosages of these two drugs because when they raised it too high, it had my mom WAY too doped up and not able to function properly, if I'm not mistaken, I believe these were the dosages that had her unable to function properly.  However, let me consult with rehab to confirm if Seroquel 50mg /Trazodone 50mg  was the combination that was "too much" for her. I will get back with you on all three meds asap.  Sincerely,  USAAemple Poteat

## 2015-01-22 NOTE — Telephone Encounter (Signed)
Second reply from pt's daughter via e-mail. Okay to advise and office visit to discuss?  Hi Dr. Jonny RuizJohn & Staff,  I am wondering what are your thoughts on meeting somewhere in the middle of what she's on now (25mg  of each) versus 50mg  each of Seroquel and Trazodone? Previously 50 mg of each of these two drugs, at the same time, caused problems.  If you are in agreement, I'm wondering if we can do 37.5mg  of Trazodone and 37.5mg  of Seroquel (generic)?  Also, in regards to BurtonNamenda, because of the potential side effects such as loss of appetite, fast heart rate, etc., especially with mom's history of ischemic strokes, I'd be hesitant to try that drug out.  In regards to upping Aricept from 5mg  to 10mg , we definitely don't want to risk running into the side effects of diarrhea.  Therefore, I think my sister and I would like to up the Trazodone and Seroquel to 37.5mg  each, nightly, and keep mom at 5mg  of Aricept for right now. Maybe, just maybe, with proper rest at night after increasing the dosage of her sleep meds, maybe 5mg  of Aricept will be just fine after all.  If you are to call these in, we'd prefer the [facility] in [place] on [address].  Thanks for your assistance and please let me know if you disagree with anything above.  Sincerely,  Yolanda [name]

## 2015-01-23 ENCOUNTER — Ambulatory Visit (INDEPENDENT_AMBULATORY_CARE_PROVIDER_SITE_OTHER): Payer: Medicare Other | Admitting: Internal Medicine

## 2015-01-23 ENCOUNTER — Other Ambulatory Visit (INDEPENDENT_AMBULATORY_CARE_PROVIDER_SITE_OTHER): Payer: Medicare Other

## 2015-01-23 ENCOUNTER — Encounter: Payer: Self-pay | Admitting: Internal Medicine

## 2015-01-23 VITALS — BP 126/84 | HR 65 | Temp 97.6°F

## 2015-01-23 DIAGNOSIS — R5383 Other fatigue: Secondary | ICD-10-CM

## 2015-01-23 DIAGNOSIS — I10 Essential (primary) hypertension: Secondary | ICD-10-CM

## 2015-01-23 DIAGNOSIS — R400 Somnolence: Secondary | ICD-10-CM

## 2015-01-23 DIAGNOSIS — G4700 Insomnia, unspecified: Secondary | ICD-10-CM

## 2015-01-23 DIAGNOSIS — E785 Hyperlipidemia, unspecified: Secondary | ICD-10-CM

## 2015-01-23 LAB — CBC WITH DIFFERENTIAL/PLATELET
Basophils Absolute: 0 10*3/uL (ref 0.0–0.1)
Basophils Relative: 0.3 % (ref 0.0–3.0)
EOS PCT: 1.4 % (ref 0.0–5.0)
Eosinophils Absolute: 0.1 10*3/uL (ref 0.0–0.7)
HCT: 43.7 % (ref 36.0–46.0)
HEMOGLOBIN: 13.9 g/dL (ref 12.0–15.0)
LYMPHS ABS: 0.9 10*3/uL (ref 0.7–4.0)
Lymphocytes Relative: 22.2 % (ref 12.0–46.0)
MCHC: 31.9 g/dL (ref 30.0–36.0)
MCV: 86 fl (ref 78.0–100.0)
MONO ABS: 0.4 10*3/uL (ref 0.1–1.0)
MONOS PCT: 10.3 % (ref 3.0–12.0)
NEUTROS ABS: 2.8 10*3/uL (ref 1.4–7.7)
Neutrophils Relative %: 65.8 % (ref 43.0–77.0)
Platelets: 205 10*3/uL (ref 150.0–400.0)
RBC: 5.08 Mil/uL (ref 3.87–5.11)
RDW: 16.1 % — ABNORMAL HIGH (ref 11.5–15.5)
WBC: 4.2 10*3/uL (ref 4.0–10.5)

## 2015-01-23 LAB — HEPATIC FUNCTION PANEL
ALBUMIN: 4.2 g/dL (ref 3.5–5.2)
ALT: 10 U/L (ref 0–35)
AST: 14 U/L (ref 0–37)
Alkaline Phosphatase: 85 U/L (ref 39–117)
BILIRUBIN DIRECT: 0.1 mg/dL (ref 0.0–0.3)
TOTAL PROTEIN: 7.3 g/dL (ref 6.0–8.3)
Total Bilirubin: 0.9 mg/dL (ref 0.2–1.2)

## 2015-01-23 LAB — URINALYSIS, ROUTINE W REFLEX MICROSCOPIC
BILIRUBIN URINE: NEGATIVE
Hgb urine dipstick: NEGATIVE
Ketones, ur: NEGATIVE
Leukocytes, UA: NEGATIVE
Nitrite: NEGATIVE
RBC / HPF: NONE SEEN (ref 0–?)
SPECIFIC GRAVITY, URINE: 1.025 (ref 1.000–1.030)
Total Protein, Urine: NEGATIVE
URINE GLUCOSE: NEGATIVE
UROBILINOGEN UA: 1 (ref 0.0–1.0)
WBC, UA: NONE SEEN (ref 0–?)
pH: 5.5 (ref 5.0–8.0)

## 2015-01-23 LAB — BASIC METABOLIC PANEL
BUN: 16 mg/dL (ref 6–23)
CHLORIDE: 104 meq/L (ref 96–112)
CO2: 27 mEq/L (ref 19–32)
Calcium: 9.6 mg/dL (ref 8.4–10.5)
Creatinine, Ser: 1.14 mg/dL (ref 0.40–1.20)
GFR: 62.25 mL/min (ref >60.00)
Glucose, Bld: 85 mg/dL (ref 70–99)
Potassium: 4.1 mEq/L (ref 3.5–5.1)
Sodium: 137 mEq/L (ref 135–145)

## 2015-01-23 LAB — LIPID PANEL
CHOLESTEROL: 137 mg/dL (ref 0–200)
HDL: 71.3 mg/dL (ref >39.00)
LDL Cholesterol: 51 mg/dL (ref 0–99)
NONHDL: 65.7
Total CHOL/HDL Ratio: 2
Triglycerides: 75 mg/dL (ref 0.0–149.0)
VLDL: 15 mg/dL (ref 0.0–40.0)

## 2015-01-23 LAB — TSH: TSH: 0.45 u[IU]/mL (ref 0.35–4.50)

## 2015-01-23 MED ORDER — QUETIAPINE FUMARATE 25 MG PO TABS
ORAL_TABLET | ORAL | Status: DC
Start: 2015-01-23 — End: 2021-09-24

## 2015-01-23 MED ORDER — TRAZODONE HCL 50 MG PO TABS: ORAL_TABLET | ORAL | Status: DC

## 2015-01-23 NOTE — Progress Notes (Signed)
Subjective:    Patient ID: Yolanda [name], female    DOB: Jan 13, 1956, 59 y.o.   MRN: [id number]  HPI  Here with daughter who puts her sister in [place] on speakerphone; c/o persistent difficulty with getting to sleep, asking for med adjustment, but also with significant snoring at night, also significant daytime sleepiness despite decresaed clonidine, just cant seem to shake the daily fatigue. Pt denies chest pain, increased sob or doe, wheezing, orthopnea, PND, increased LE swelling, palpitations, dizziness or syncope.  Pt denies new neurological symptoms such as new headache, or facial or extremity weakness or numbness   Pt denies polydipsia, polyuria, Past Medical History  Diagnosis Date  . B12 DEFICIENCY 12/01/2009  . VITAMIN D DEFICIENCY 06/03/2010  . HYPERLIPIDEMIA 10/25/2007  . ANXIETY 10/25/2007  . DEPRESSION 10/25/2007  . HEMIPARESIS, LEFT 11/23/2009  . Unspecified visual loss 11/23/2009  . MITRAL INSUFFICIENCY 10/25/2007  . HYPERTENSION 10/25/2007  . BRADYCARDIA 11/19/2009  . PERIPHERAL VASCULAR DISEASE 10/25/2007  . ALLERGIC RHINITIS 10/25/2007  . COPD 10/25/2007  . GERD 10/25/2007  . BACK PAIN 10/25/2007  . OSTEOPOROSIS 10/25/2007  . OSTEOPENIA 11/19/2009  . INSOMNIA-SLEEP DISORDER-UNSPEC 10/25/2007  . FATIGUE 10/25/2007  . Headache(784.0) 10/25/2007  . CHEST PAIN 12/31/2007  . CEREBROVASCULAR ACCIDENT, HX OF 10/25/2007   Past Surgical History  Procedure Laterality Date  . Cholecystectomy    . Tubal ligation      reports that she has been smoking.  She does not have any smokeless tobacco history on file. She reports that she drinks alcohol. She reports that she does not use illicit drugs. family history includes Alcohol abuse in her other; Arthritis in her other; Depression in her other; Heart disease in her other; Hypertension in her other; Stroke in her other. Allergies  Allergen Reactions  . Ace Inhibitors    Current Outpatient Prescriptions on File Prior to Visit  Medication Sig Dispense Refill   . alendronate (FOSAMAX) 70 MG tablet Take 1 tablet (70 mg total) by mouth once a week. Take with a full glass of water on an empty stomach. 90 tablet 3  . amLODipine (NORVASC) 10 MG tablet Take 1 tablet (10 mg total) by mouth daily. 90 tablet 3  . Calcium Carbonate (CALCIUM 500 PO) Take by mouth 2 (two) times daily.      . cholecalciferol (VITAMIN D) 1000 UNITS tablet Take 1,000 Units by mouth daily.      . cloNIDine (CATAPRES) 0.1 MG tablet Take 1 tablet (0.1 mg total) by mouth 2 (two) times daily. 60 tablet 11  . donepezil (ARICEPT) 5 MG tablet Take 1 tablet (5 mg total) by mouth at bedtime. 90 tablet 3  . ferrous sulfate 325 (65 FE) MG tablet Take 325 mg by mouth. 1 by mouth daily for 3 months     . Folic Acid-Vit B6-Vit B12 (FOLBEE) 2.5-25-1 MG TABS tablet Take 1 tablet by mouth daily. 90 tablet 3  . haloperidol (HALDOL) 5 MG tablet 1/2 tab by mouth every 6 hrs as needed for agitation or psychosis 60 tablet 2  . lovastatin (MEVACOR) 20 MG tablet 3 tabs by mouth per day 270 tablet 3  . Melatonin-Pyridoxine 3-1 MG TABS Take by mouth.    . Multiple Vitamin (MULTIVITAMIN) tablet Take 1 tablet by mouth daily.      Marland Kitchen oxyCODONE (OXY IR/ROXICODONE) 5 MG immediate release tablet Take 1 tablet (5 mg total) by mouth every 6 (six) hours as needed for severe pain. 120 tablet 0  .  pantoprazole (PROTONIX) 40 MG tablet Take 1 tablet (40 mg total) by mouth daily. 90 tablet 1  . senna (SENOKOT) 8.6 MG tablet Take 1 tablet by mouth daily.    . sodium chloride (OCEAN) 0.65 % SOLN nasal spray Place 1 spray into both nostrils as needed for congestion.    Marland Kitchen. spironolactone (ALDACTONE) 25 MG tablet Take 1 tablet (25 mg total) by mouth daily. 90 tablet 3  . vitamin C (ASCORBIC ACID) 500 MG tablet Take 500 mg by mouth daily.     No current facility-administered medications on file prior to visit.   Review of Systems  Constitutional: Negative for unusual diaphoresis or night sweats HENT: Negative for ringing in ear  or discharge Eyes: Negative for double vision or worsening visual disturbance.  Respiratory: Negative for choking and stridor.   Gastrointestinal: Negative for vomiting or other signifcant bowel change Genitourinary: Negative for hematuria or change in urine volume.  Musculoskeletal: Negative for other MSK pain or swelling Skin: Negative for color change and worsening wound.  Neurological: Negative for tremors and numbness other than noted  Psychiatric/Behavioral: Negative for decreased concentration or agitation other than above       Objective:   Physical Exam BP 126/84 mmHg  Pulse 65  Temp(Src) 97.6 F (36.4 C) (Oral)  SpO2 97% VS noted, examined in wheelchair Constitutional: Pt appears in no significant distress HENT: Head: NCAT.  Right Ear: External ear normal.  Left Ear: External ear normal.  Eyes: . Pupils are equal, round, and reactive to light. Conjunctivae and EOM are normal Neck: Normal range of motion. Neck supple.  Cardiovascular: Normal rate and regular rhythm.   Pulmonary/Chest: Effort normal and breath sounds without rales or wheezing.  Abd:  Soft, NT, ND, + BS Neurological: Pt is alert. Not confused , motor grossly intact, + dementia Skin: Skin is warm. No rash, no LE edema Psychiatric: Pt behavior is normal. No agitation.     Assessment & Plan:

## 2015-01-23 NOTE — Assessment & Plan Note (Signed)
Ok for small increases seroquel/trazodone,  to f/u any worsening symptoms or concerns

## 2015-01-23 NOTE — Assessment & Plan Note (Signed)
Etiology unclear, Exam otherwise benign, to check labs as documented, follow with expectant management  

## 2015-01-23 NOTE — Assessment & Plan Note (Addendum)
?   Sleep apnea, for pulm referral, ? Need sleep study  Note:  Total time for pt hx, exam, review of record with pt in the room, determination of diagnoses and plan for further eval and tx is > 40 min, with over 50% spent in coordination and counseling of patient

## 2015-01-23 NOTE — Progress Notes (Signed)
Pre visit review using our clinic review tool, if applicable. No additional management support is needed unless otherwise documented below in the visit note. 

## 2015-01-23 NOTE — Patient Instructions (Signed)
OK to increase the seroquel and trazodone as discussed  Please continue all other medications as before, and refills have been done if requested.  Please have the pharmacy call with any other refills you may need.  Please keep your appointments with your specialists as you may have planned  You will be contacted regarding the referral for: pulmonary for the question of sleep apnea  Please go to the LAB in the Basement (turn left off the elevator) for the tests to be done today  You will be contacted by phone if any changes need to be made immediately.  Otherwise, you will receive a letter about your results with an explanation, but please check with MyChart first.  Please remember to sign up for MyChart if you have not done so, as this will be important to you in the future with finding out test results, communicating by private email, and scheduling acute appointments online when needed.  Please return in 6 months, or sooner if needed

## 2015-01-23 NOTE — Assessment & Plan Note (Signed)
stable overall by history and exam, recent data reviewed with pt, and pt to continue medical treatment as before,  to f/u any worsening symptoms or concerns Lab Results  Component Value Date   LDLCALC 51 01/23/2015

## 2015-01-23 NOTE — Assessment & Plan Note (Signed)
stable overall by history and exam, recent data reviewed with pt, and pt to continue medical treatment as before,  to f/u any worsening symptoms or concerns BP Readings from Last 3 Encounters:  01/23/15 126/84  11/26/14 114/74  06/11/14 112/80

## 2015-01-28 NOTE — Telephone Encounter (Signed)
OV completed 01/23/15

## 2015-02-11 ENCOUNTER — Telehealth: Payer: Self-pay | Admitting: Internal Medicine

## 2015-02-11 NOTE — Telephone Encounter (Signed)
Patient's daughter has called in stating she called last week but I do not see a phone note.  Daughter states that her mother use to get some of her medications through the drug companies.  I did look back under encounters and I do not see any applications scanned.  Can you please look behind me to see if any of her meds where in fact ordered through the drug companies.  Daughter also would like to know if we can not find this if we knew what companies she could get in contact with to see if they will cover any of the meds she is currently on.  Daughter states patient has alzheimers and does not remember.

## 2015-02-12 ENCOUNTER — Encounter: Payer: Self-pay | Admitting: Internal Medicine

## 2015-02-12 NOTE — Telephone Encounter (Signed)
Spoke to pt dtr. Informed that we do not have records of the forms (free meds) and pt may be speaking of samples. The pt assistance forms are attained by our pts and that we sign when the pt brings them to [place]. I also informed of 90 d supply vs 30 d supply and that shopping around may be advantageous to obtaining the best price for her mothers meds.

## 2015-02-27 ENCOUNTER — Ambulatory Visit: Payer: Medicare Other | Admitting: Internal Medicine

## 2015-03-06 DIAGNOSIS — N1831 Chronic kidney disease, stage 3a: Secondary | ICD-10-CM | POA: Diagnosis present

## 2015-03-10 ENCOUNTER — Telehealth: Payer: Self-pay | Admitting: *Deleted

## 2015-03-10 NOTE — Telephone Encounter (Signed)
[facility] - Client TELEPHONE ADVICE RECORD [facility] Patient Name: Yolanda [name] Gender: Female DOB: Mar 25, 1956 Age: 59 Y 5 M 19 D Return Phone Number: [phone] (Primary), [phone] (Secondary) Address: [address] City/State/Zip: [place] [address] Client [facility] - Client Client Site [facility] Night Physician Oliver Barre Contact Type Call Call Type Triage / Clinical Caller Name Denver Faster Relationship To Patient Daughter Return Phone Number [phone] (Primary) Chief Complaint Dizziness Initial Comment Caller states mothers BP is low, 108/89, has dizziness; neither dtr is with pt; but caregiver is with her; PreDisposition Call Doctor Nurse Assessment Nurse: Chrys Racer, RN, Alexia Freestone Date/Time Lamount Cohen Time): 03/08/2015 1:00:43 PM Confirm and document reason for call. If symptomatic, describe symptoms. ---Caller states mothers BP is low, 108/89, has dizziness; neither dtr is with pt; but caregiver is with her; Caretaker states pt had BP med today, and has one scheduled tonight. Pt is able to walk about and perform ADL. however; "feels weak" Has the patient traveled out of the country within the last 30 days? ---No Does the patient require triage? ---Yes Related visit to physician within the last 2 weeks? ---Yes Does the PT have any chronic conditions? (i.e. diabetes, asthma, etc.) ---Yes List chronic conditions. ---high cholesteroll, HBP, acid reflux (hx of multiple aneurysms w Surg intervention) Guidelines Guideline Title Affirmed Question Affirmed Notes Nurse Date/Time (Eastern Time) Dizziness - Lightheadedness Dizziness caused by sudden or prolonged standing (all triage questions negative) Chrys Racer, RN, Alexia Freestone 03/08/2015 1:23:44 PM Disp. Time Lamount Cohen Time) Disposition Final User 03/08/2015 1:37:18 PM Home Care Yes Chrys Racer, RN, Alexia Freestone Caller Understands: Yes PLEASE  NOTE: All timestamps contained within this report are represented as [place] Standard Time. CONFIDENTIALTY NOTICE: This fax transmission is intended only for the addressee. It contains information that is legally privileged, confidential or otherwise protected from use or disclosure. If you are not the intended recipient, you are strictly prohibited from reviewing, disclosing, copying using or disseminating any of this information or taking any action in reliance on or regarding this information. If you have received this fax in error, please notify us immediately by telephone so that we can arrange for its return to [place]. Phone: [phone], Toll-Free: [phone], Fax: [phone] Page: 2 of 2 Call Id: [id number] Disagree/Comply: Comply Care Advice Given Per Guideline HOME CARE: You should be able to treat this at home. REASSURANCE: Standing up suddenly (especially getting out of bed) or prolonged standing in one place are common causes of temporary dizziness. Not drinking enough fluids or eating enough salt always makes it worse. STANDING: * In the mornings, sit up for a few minutes before you stand up. That will help your circulation make the adjustment. * If you have to stand up for a long period, contract and relax your leg muscles to help pump the blood back to the heart. * Sit down or lie down if you feel dizzy. FLUIDS: Drink several glasses of fruit juice, other clear fluids or water. This will improve hydration and blood glucose. If the weather is hot, make sure the fluids are cold. REST: Lie down with feet elevated for 1 hour. This will improve circulation and increase blood flow to the brain. COOL OFF: If the weather is hot, apply a cold compress to the forehead or take a cool shower or bath. PREVENTION: * Regular mealtimes and snacks * Adequate sleep and rest. CALL BACK IF: * After 2 hours of rest and  fluids AND still feeling dizzy. * Passes out (faints). * You become worse. CARE  ADVICE given per Dizziness (Adult) guideline. After Care Instructions Given Call Event Type User Date / Time Description

## 2015-03-30 ENCOUNTER — Other Ambulatory Visit: Payer: Self-pay | Admitting: Internal Medicine

## 2015-03-31 ENCOUNTER — Telehealth: Payer: Self-pay | Admitting: *Deleted

## 2015-03-31 MED ORDER — TRAZODONE HCL 50 MG PO TABS: ORAL_TABLET | ORAL | Status: DC

## 2015-03-31 NOTE — Telephone Encounter (Signed)
Pharmacist left msg on triage stating receive script back on Trazodone stating denied med was refill on 01/31/15. We never receive script from that date. Requesting rx to be resent. Per chart rx was printed but not sure what happen to script. Re-sent electronically...Raechel Chute

## 2015-05-15 ENCOUNTER — Institutional Professional Consult (permissible substitution): Payer: Medicare Other | Admitting: Internal Medicine

## 2015-05-23 ENCOUNTER — Other Ambulatory Visit: Payer: Self-pay | Admitting: Internal Medicine

## 2015-08-18 NOTE — Telephone Encounter (Signed)
Pt never picked up script. Voided rx...Raechel Chute

## 2019-08-20 ENCOUNTER — Other Ambulatory Visit: Payer: Self-pay | Admitting: Family Medicine

## 2019-08-20 DIAGNOSIS — M810 Age-related osteoporosis without current pathological fracture: Secondary | ICD-10-CM

## 2019-12-31 ENCOUNTER — Other Ambulatory Visit: Payer: Self-pay

## 2019-12-31 ENCOUNTER — Encounter (INDEPENDENT_AMBULATORY_CARE_PROVIDER_SITE_OTHER): Payer: Self-pay | Admitting: Vascular Surgery

## 2019-12-31 ENCOUNTER — Other Ambulatory Visit (INDEPENDENT_AMBULATORY_CARE_PROVIDER_SITE_OTHER): Payer: Self-pay | Admitting: Vascular Surgery

## 2019-12-31 ENCOUNTER — Ambulatory Visit (INDEPENDENT_AMBULATORY_CARE_PROVIDER_SITE_OTHER): Payer: Medicare Other | Admitting: Vascular Surgery

## 2019-12-31 ENCOUNTER — Ambulatory Visit (INDEPENDENT_AMBULATORY_CARE_PROVIDER_SITE_OTHER): Payer: Medicare Other

## 2019-12-31 VITALS — BP 122/81 | HR 101 | Resp 16 | Ht 67.0 in | Wt 177.8 lb

## 2019-12-31 DIAGNOSIS — I739 Peripheral vascular disease, unspecified: Secondary | ICD-10-CM | POA: Diagnosis not present

## 2019-12-31 DIAGNOSIS — E785 Hyperlipidemia, unspecified: Secondary | ICD-10-CM | POA: Diagnosis not present

## 2019-12-31 DIAGNOSIS — I639 Cerebral infarction, unspecified: Secondary | ICD-10-CM

## 2019-12-31 DIAGNOSIS — I10 Essential (primary) hypertension: Secondary | ICD-10-CM | POA: Diagnosis not present

## 2019-12-31 NOTE — Assessment & Plan Note (Signed)
The patient has symptoms worrisome for arterial insufficiency with claudication.  She has multiple atherosclerotic risk factors and so we elected to assess her for vascular disease today.  Her arterial studies today demonstrate no evidence of arterial insufficiency.  Her right ABI is 1.0 and her left ABI 0.99 with brisk triphasic waveforms and normal digital pressures bilaterally.  It does not appear as if her lower extremity symptoms are related to poor perfusion.  Neurogenic symptoms would be suspected at this point.  No further vascular evaluation or testing is planned at this time.  I will see the patient back on an as-needed basis.

## 2019-12-31 NOTE — Patient Instructions (Signed)
Peripheral Vascular Disease  Peripheral vascular disease (PVD) is a disease of the blood vessels that are not part of your heart and brain. A simple term for PVD is poor circulation. In most cases, PVD narrows the blood vessels that carry blood from your heart to the rest of your body. This can reduce the supply of blood to your arms, legs, and internal organs, like your stomach or kidneys. However, PVD most often affects a person's lower legs and feet. Without treatment, PVD tends to get worse. PVD can also lead to acute ischemic limb. This is when an arm or leg suddenly cannot get enough blood. This is a medical emergency. Follow these instructions at home: Lifestyle  Do not use any products that contain nicotine or tobacco, such as cigarettes and e-cigarettes. If you need help quitting, ask your doctor.  Lose weight if you are overweight. Or, stay at a healthy weight as told by your doctor.  Eat a diet that is low in fat and cholesterol. If you need help, ask your doctor.  Exercise regularly. Ask your doctor for activities that are right for you. General instructions  Take over-the-counter and prescription medicines only as told by your doctor.  Take good care of your feet: ? Wear comfortable shoes that fit well. ? Check your feet often for any cuts or sores.  Keep all follow-up visits as told by your doctor This is important. Contact a doctor if:  You have cramps in your legs when you walk.  You have leg pain when you are at rest.  You have coldness in a leg or foot.  Your skin changes.  You are unable to get or have an erection (erectile dysfunction).  You have cuts or sores on your feet that do not heal. Get help right away if:  Your arm or leg turns cold, numb, and blue.  Your arms or legs become red, warm, swollen, painful, or numb.  You have chest pain.  You have trouble breathing.  You suddenly have weakness in your face, arm, or leg.  You become very  confused or you cannot speak.  You suddenly have a very bad headache.  You suddenly cannot see. Summary  Peripheral vascular disease (PVD) is a disease of the blood vessels.  A simple term for PVD is poor circulation. Without treatment, PVD tends to get worse.  Treatment may include exercise, low fat and low cholesterol diet, and quitting smoking. This information is not intended to replace advice given to you by your health care provider. Make sure you discuss any questions you have with your health care provider. Document Revised: 06/16/2017 Document Reviewed: 08/11/2016 Elsevier Patient Education  2020 [employer]  

## 2019-12-31 NOTE — Assessment & Plan Note (Signed)
blood pressure control important in reducing the progression of atherosclerotic disease. On appropriate oral medications.  

## 2019-12-31 NOTE — Assessment & Plan Note (Signed)
Patient had a cerebral aneurysm rupture many years ago with resultant stroke.  There certainly could be a neuropathic component to some of her lower extremity symptoms.

## 2019-12-31 NOTE — Assessment & Plan Note (Signed)
lipid control important in reducing the progression of atherosclerotic disease. Continue statin therapy  

## 2019-12-31 NOTE — Progress Notes (Signed)
Patient ID: Yolanda [name], female   DOB: 1955-11-25, 64 y.o.   MRN: [id number]  Chief Complaint  Patient presents with  . New Patient (Initial Visit)    ref Schrack claudication    HPI Yolanda [name] is a 64 y.o. female.  I am asked to see the patient by E. Brugger for evaluation of PAD.  The patient's daughter provides much of the history although she does contribute some to the history herself.  She had a ruptured cerebral aneurysm many years ago and has had a stroke with resultant limitations from this.  Her legs have become weaker and tired her over time.  She walks relatively short distances before having to stop and rest.  She denies any open ulcerations or infection.  Mild swelling is occasionally present but this is not severe.  They deny any previous vascular surgery or intervention to their knowledge.     Past Medical History:  Diagnosis Date  . ALLERGIC RHINITIS 10/25/2007  . ANXIETY 10/25/2007  . B12 DEFICIENCY 12/01/2009  . BACK PAIN 10/25/2007  . BRADYCARDIA 11/19/2009  . CEREBROVASCULAR ACCIDENT, HX OF 10/25/2007  . CHEST PAIN 12/31/2007  . COPD 10/25/2007  . DEPRESSION 10/25/2007  . FATIGUE 10/25/2007  . GERD 10/25/2007  . Headache(784.0) 10/25/2007  . HEMIPARESIS, LEFT 11/23/2009  . HYPERLIPIDEMIA 10/25/2007  . HYPERTENSION 10/25/2007  . INSOMNIA-SLEEP DISORDER-UNSPEC 10/25/2007  . MITRAL INSUFFICIENCY 10/25/2007  . OSTEOPENIA 11/19/2009  . OSTEOPOROSIS 10/25/2007  . PERIPHERAL VASCULAR DISEASE 10/25/2007  . Unspecified visual loss 11/23/2009  . VITAMIN D DEFICIENCY 06/03/2010    Past Surgical History:  Procedure Laterality Date  . CHOLECYSTECTOMY    . TUBAL LIGATION       Family History  Problem Relation Age of Onset  . Alcohol abuse Other        ETOH Dependence  . Arthritis Other   . Heart disease Other   . Hypertension Other   . Stroke Other   . Depression Other   No bleeding disorders, clotting disorders, autoimmune diseases, or aneurysms   Social History   Tobacco Use  .  Smoking status: Former Games developer  . Smokeless tobacco: Never Used  Substance Use Topics  . Alcohol use: Yes  . Drug use: No     Allergies  Allergen Reactions  . Latex Swelling    Lip swelled after having hair colored by person wearing latex gloves  . Ace Inhibitors     Current Outpatient Medications  Medication Sig Dispense Refill  . alendronate (FOSAMAX) 70 MG tablet TAKE 1 TABLET BY MOUTH ONCE A WEEK WITH GLASS OF WATER ON AN EMPTY STOMACH 12 tablet 1  . amLODipine (NORVASC) 10 MG tablet Take 1 tablet (10 mg total) by mouth daily. 90 tablet 3  . aspirin EC 81 MG tablet Take 81 mg by mouth daily. Swallow whole.    . hydrALAZINE (APRESOLINE) 100 MG tablet Take by mouth.    . lovastatin (MEVACOR) 20 MG tablet 3 tabs by mouth per day 270 tablet 3  . Melatonin-Pyridoxine 3-1 MG TABS Take by mouth.    . potassium chloride SA (KLOR-CON) 20 MEQ tablet Take by mouth.    . spironolactone (ALDACTONE) 25 MG tablet Take 1 tablet (25 mg total) by mouth daily. 90 tablet 3  . traZODone (DESYREL) 50 MG tablet Take 3/4 tab by mouth at bedtime 41 tablet 3  . Calcium Carbonate (CALCIUM 500 PO) Take by mouth 2 (two) times daily.   (Patient not taking:  Reported on 12/31/2019)    . cholecalciferol (VITAMIN D) 1000 UNITS tablet Take 1,000 Units by mouth daily.   (Patient not taking: Reported on 12/31/2019)    . cloNIDine (CATAPRES) 0.1 MG tablet Take 1 tablet (0.1 mg total) by mouth 2 (two) times daily. (Patient not taking: Reported on 12/31/2019) 60 tablet 11  . donepezil (ARICEPT) 5 MG tablet Take 1 tablet (5 mg total) by mouth at bedtime. (Patient not taking: Reported on 12/31/2019) 90 tablet 3  . ferrous sulfate 325 (65 FE) MG tablet Take 325 mg by mouth. 1 by mouth daily for 3 months  (Patient not taking: Reported on 12/31/2019)    . Folic Acid-Vit O1-HYQ M57 (FOLBEE) 2.5-25-1 MG TABS tablet Take 1 tablet by mouth daily. (Patient not taking: Reported on 12/31/2019) 90 tablet 3  . haloperidol (HALDOL) 5 MG  tablet 1/2 tab by mouth every 6 hrs as needed for agitation or psychosis (Patient not taking: Reported on 12/31/2019) 60 tablet 2  . Multiple Vitamin (MULTIVITAMIN) tablet Take 1 tablet by mouth daily.   (Patient not taking: Reported on 12/31/2019)    . oxyCODONE (OXY IR/ROXICODONE) 5 MG immediate release tablet Take 1 tablet (5 mg total) by mouth every 6 (six) hours as needed for severe pain. (Patient not taking: Reported on 12/31/2019) 120 tablet 0  . pantoprazole (PROTONIX) 40 MG tablet Take 1 tablet (40 mg total) by mouth daily. (Patient not taking: Reported on 12/31/2019) 90 tablet 1  . QUEtiapine (SEROQUEL) 25 MG tablet Take 1 and 1?2 tab by mouth at bedtime (Patient not taking: Reported on 12/31/2019) 135 tablet 3  . senna (SENOKOT) 8.6 MG tablet Take 1 tablet by mouth daily. (Patient not taking: Reported on 12/31/2019)    . sodium chloride (OCEAN) 0.65 % SOLN nasal spray Place 1 spray into both nostrils as needed for congestion. (Patient not taking: Reported on 12/31/2019)    . vitamin C (ASCORBIC ACID) 500 MG tablet Take 500 mg by mouth daily. (Patient not taking: Reported on 12/31/2019)     No current facility-administered medications for this visit.      REVIEW OF SYSTEMS (Negative unless checked)  Constitutional: [] Weight loss  [] Fever  [] Chills Cardiac: [] Chest pain   [] Chest pressure   [] Palpitations   [] Shortness of breath when laying flat   [] Shortness of breath at rest   [] Shortness of breath with exertion. Vascular:  [x] Pain in legs with walking   [] Pain in legs at rest   [] Pain in legs when laying flat   [] Claudication   [] Pain in feet when walking  [] Pain in feet at rest  [] Pain in feet when laying flat   [] History of DVT   [] Phlebitis   [] Swelling in legs   [] Varicose veins   [] Non-healing ulcers Pulmonary:   [] Uses home oxygen   [] Productive cough   [] Hemoptysis   [] Wheeze  [] COPD   [] Asthma Neurologic:  [] Dizziness  [] Blackouts   [] Seizures   [x] History of stroke   [] History of TIA   [] Aphasia   [] Temporary blindness   [] Dysphagia   [] Weakness or numbness in arms   [] Weakness or numbness in legs Musculoskeletal:  [x] Arthritis   [] Joint swelling   [] Joint pain   [] Low back pain Hematologic:  [] Easy bruising  [] Easy bleeding   [] Hypercoagulable state   [] Anemic  [] Hepatitis Gastrointestinal:  [] Blood in stool   [] Vomiting blood  [] Gastroesophageal reflux/heartburn   [] Abdominal pain Genitourinary:  [] Chronic kidney disease   [] Difficult urination  [] Frequent urination  [] Burning  with urination   [] Hematuria Skin:  [] Rashes   [] Ulcers   [] Wounds Psychological:  [] History of anxiety   []  History of major depression.    Physical Exam BP 122/81 (BP Location: Right Arm)   Pulse (!) 101   Resp 16   Ht 5\' 7"  (1.702 m)   Wt 177 lb 12.8 oz (80.6 kg)   BMI 27.85 kg/m  Gen:  WD/WN, NAD Head: [place]/AT, No temporalis wasting.  Ear/Nose/Throat: Hearing grossly intact, nares w/o erythema or drainage, oropharynx w/o Erythema/Exudate Eyes: Conjunctiva clear, sclera non-icteric  Neck: trachea midline.  No JVD.  Pulmonary:  Good air movement, respirations not labored, no use of accessory muscles  Cardiac: RRR, no JVD Vascular:  Vessel Right Left  Radial Palpable Palpable                          DP 2+ 1+  PT 1+ 2+   Gastrointestinal:. No masses, surgical incisions, or scars. Musculoskeletal: M/S 5/5 throughout.  Extremities without ischemic changes.  No deformity or atrophy. Trace LE edema. Neurologic: Sensation grossly intact in extremities.  Symmetrical.  Speech is fluent. Motor exam as listed above. Psychiatric: Judgment intact, Mood & affect appropriate for pt's clinical situation. Dermatologic: No rashes or ulcers noted.  No cellulitis or open wounds.    Radiology VAS ABI WITH/WO TBI  Result Date: 12/31/2019 LOWER EXTREMITY DOPPLER STUDY Indications: Claudication.  Performing Technologist: RVT  Examination Guidelines: A complete evaluation includes  at minimum, Doppler waveform signals and systolic blood pressure reading at the level of bilateral brachial, anterior tibial, and posterior tibial arteries, when vessel segments are accessible. Bilateral testing is considered an integral part of a complete examination. Photoelectric Plethysmograph (PPG) waveforms and toe systolic pressure readings are included as required and additional duplex testing as needed. Limited examinations for reoccurring indications may be performed as noted.  ABI Findings: +---------+------------------+-----+---------+--------+ Right    Rt Pressure (mmHg)IndexWaveform Comment  +---------+------------------+-----+---------+--------+ Brachial 141                                      +---------+------------------+-----+---------+--------+ ATA      141               1.00 triphasic         +---------+------------------+-----+---------+--------+ PTA      140               0.99 triphasic         +---------+------------------+-----+---------+--------+ Great Toe108               0.77 Normal            +---------+------------------+-----+---------+--------+ +---------+------------------+-----+---------+-------+ Left     Lt Pressure (mmHg)IndexWaveform Comment +---------+------------------+-----+---------+-------+ Brachial 140                                     +---------+------------------+-----+---------+-------+ ATA      139               0.99 triphasic        +---------+------------------+-----+---------+-------+ PTA      140               0.99 triphasic        +---------+------------------+-----+---------+-------+ [facility]  0.83 Normal           +---------+------------------+-----+---------+-------+  Summary: Right: Resting right ankle-brachial index is within normal range. No evidence of significant right lower extremity arterial disease. The right toe-brachial index is normal. Left: Resting left ankle-brachial  index is within normal range. No evidence of significant left lower extremity arterial disease. The left toe-brachial index is normal. Bilateral: Bilateral ankle-brachial indexes are within normal range. No evidence of significant lower extremity arterial disease. Bilateral toe-brachial indexes are within normal range.  *See table(s) above for measurements and observations.  Electronically signed by Festus Barren MD on 12/31/2019 at 11:59:58 AM.   Final     Labs No results found for this or any previous visit (from the past 2160 hour(s)).  Assessment/Plan:  Essential hypertension blood pressure control important in reducing the progression of atherosclerotic disease. On appropriate oral medications.   Stroke Patient had a cerebral aneurysm rupture many years ago with resultant stroke.  There certainly could be a neuropathic component to some of her lower extremity symptoms.  Hyperlipidemia lipid control important in reducing the progression of atherosclerotic disease. Continue statin therapy   Claudication [facility]) The patient has symptoms worrisome for arterial insufficiency with claudication.  She has multiple atherosclerotic risk factors and so we elected to assess her for vascular disease today.  Her arterial studies today demonstrate no evidence of arterial insufficiency.  Her right ABI is 1.0 and her left ABI 0.99 with brisk triphasic waveforms and normal digital pressures bilaterally.  It does not appear as if her lower extremity symptoms are related to poor perfusion.  Neurogenic symptoms would be suspected at this point.  No further vascular evaluation or testing is planned at this time.  I will see the patient back on an as-needed basis.      Festus Barren 12/31/2019, 5:03 PM   This note was created with Dragon medical transcription system.  Any errors from dictation are unintentional.

## 2020-01-02 ENCOUNTER — Encounter (INDEPENDENT_AMBULATORY_CARE_PROVIDER_SITE_OTHER): Payer: Self-pay

## 2020-02-11 ENCOUNTER — Other Ambulatory Visit: Payer: Self-pay | Admitting: Nephrology

## 2020-02-11 DIAGNOSIS — N1832 Chronic kidney disease, stage 3b: Secondary | ICD-10-CM

## 2020-02-18 ENCOUNTER — Other Ambulatory Visit: Payer: Self-pay

## 2020-02-18 ENCOUNTER — Ambulatory Visit
Admission: RE | Admit: 2020-02-18 | Discharge: 2020-02-18 | Disposition: A | Discharge status: Home / Self Care | Payer: Medicare Other | Source: Ambulatory Visit | Attending: Nephrology | Admitting: Nephrology

## 2020-02-18 DIAGNOSIS — N1832 Chronic kidney disease, stage 3b: Secondary | ICD-10-CM | POA: Insufficient documentation

## 2020-03-31 ENCOUNTER — Encounter (INDEPENDENT_AMBULATORY_CARE_PROVIDER_SITE_OTHER): Payer: Self-pay

## 2020-11-09 ENCOUNTER — Other Ambulatory Visit: Payer: Self-pay | Admitting: Physician Assistant

## 2020-11-09 DIAGNOSIS — Z780 Asymptomatic menopausal state: Secondary | ICD-10-CM

## 2020-11-09 DIAGNOSIS — Z7983 Long term (current) use of bisphosphonates: Secondary | ICD-10-CM

## 2020-11-09 DIAGNOSIS — M810 Age-related osteoporosis without current pathological fracture: Secondary | ICD-10-CM

## 2021-09-24 ENCOUNTER — Emergency Department: Payer: Medicare Other

## 2021-09-24 ENCOUNTER — Inpatient Hospital Stay
Admission: EM | Admit: 2021-09-24 | Discharge: 2021-10-01 | DRG: 803 | Disposition: A | Discharge status: Home Health | Payer: Medicare Other | Attending: Internal Medicine | Admitting: Internal Medicine

## 2021-09-24 ENCOUNTER — Other Ambulatory Visit: Payer: Self-pay

## 2021-09-24 DIAGNOSIS — Z8249 Family history of ischemic heart disease and other diseases of the circulatory system: Secondary | ICD-10-CM

## 2021-09-24 DIAGNOSIS — G4700 Insomnia, unspecified: Secondary | ICD-10-CM | POA: Diagnosis present

## 2021-09-24 DIAGNOSIS — D649 Anemia, unspecified: Secondary | ICD-10-CM | POA: Diagnosis not present

## 2021-09-24 DIAGNOSIS — I10 Essential (primary) hypertension: Secondary | ICD-10-CM | POA: Diagnosis not present

## 2021-09-24 DIAGNOSIS — F0390 Unspecified dementia without behavioral disturbance: Secondary | ICD-10-CM | POA: Diagnosis present

## 2021-09-24 DIAGNOSIS — F32A Depression, unspecified: Secondary | ICD-10-CM | POA: Diagnosis present

## 2021-09-24 DIAGNOSIS — Z20822 Contact with and (suspected) exposure to covid-19: Secondary | ICD-10-CM | POA: Diagnosis present

## 2021-09-24 DIAGNOSIS — I69354 Hemiplegia and hemiparesis following cerebral infarction affecting left non-dominant side: Secondary | ICD-10-CM

## 2021-09-24 DIAGNOSIS — Z87891 Personal history of nicotine dependence: Secondary | ICD-10-CM

## 2021-09-24 DIAGNOSIS — Z7982 Long term (current) use of aspirin: Secondary | ICD-10-CM

## 2021-09-24 DIAGNOSIS — M810 Age-related osteoporosis without current pathological fracture: Secondary | ICD-10-CM | POA: Diagnosis present

## 2021-09-24 DIAGNOSIS — Z982 Presence of cerebrospinal fluid drainage device: Secondary | ICD-10-CM

## 2021-09-24 DIAGNOSIS — F0394 Unspecified dementia, unspecified severity, with anxiety: Secondary | ICD-10-CM | POA: Diagnosis present

## 2021-09-24 DIAGNOSIS — E559 Vitamin D deficiency, unspecified: Secondary | ICD-10-CM | POA: Diagnosis present

## 2021-09-24 DIAGNOSIS — E785 Hyperlipidemia, unspecified: Secondary | ICD-10-CM | POA: Diagnosis present

## 2021-09-24 DIAGNOSIS — J309 Allergic rhinitis, unspecified: Secondary | ICD-10-CM | POA: Diagnosis present

## 2021-09-24 DIAGNOSIS — N179 Acute kidney failure, unspecified: Secondary | ICD-10-CM | POA: Diagnosis not present

## 2021-09-24 DIAGNOSIS — Z79899 Other long term (current) drug therapy: Secondary | ICD-10-CM

## 2021-09-24 DIAGNOSIS — Z888 Allergy status to other drugs, medicaments and biological substances status: Secondary | ICD-10-CM

## 2021-09-24 DIAGNOSIS — R55 Syncope and collapse: Secondary | ICD-10-CM | POA: Diagnosis present

## 2021-09-24 DIAGNOSIS — I69359 Hemiplegia and hemiparesis following cerebral infarction affecting unspecified side: Secondary | ICD-10-CM | POA: Diagnosis not present

## 2021-09-24 DIAGNOSIS — K317 Polyp of stomach and duodenum: Secondary | ICD-10-CM

## 2021-09-24 DIAGNOSIS — K219 Gastro-esophageal reflux disease without esophagitis: Secondary | ICD-10-CM | POA: Diagnosis present

## 2021-09-24 DIAGNOSIS — I129 Hypertensive chronic kidney disease with stage 1 through stage 4 chronic kidney disease, or unspecified chronic kidney disease: Secondary | ICD-10-CM | POA: Diagnosis present

## 2021-09-24 DIAGNOSIS — Z602 Problems related to living alone: Secondary | ICD-10-CM | POA: Diagnosis present

## 2021-09-24 DIAGNOSIS — D519 Vitamin B12 deficiency anemia, unspecified: Secondary | ICD-10-CM | POA: Diagnosis not present

## 2021-09-24 DIAGNOSIS — Z9049 Acquired absence of other specified parts of digestive tract: Secondary | ICD-10-CM

## 2021-09-24 DIAGNOSIS — D509 Iron deficiency anemia, unspecified: Secondary | ICD-10-CM | POA: Diagnosis present

## 2021-09-24 DIAGNOSIS — Z7983 Long term (current) use of bisphosphonates: Secondary | ICD-10-CM

## 2021-09-24 DIAGNOSIS — I739 Peripheral vascular disease, unspecified: Secondary | ICD-10-CM | POA: Diagnosis present

## 2021-09-24 DIAGNOSIS — K635 Polyp of colon: Secondary | ICD-10-CM

## 2021-09-24 DIAGNOSIS — I82409 Acute embolism and thrombosis of unspecified deep veins of unspecified lower extremity: Secondary | ICD-10-CM

## 2021-09-24 DIAGNOSIS — J449 Chronic obstructive pulmonary disease, unspecified: Secondary | ICD-10-CM | POA: Diagnosis present

## 2021-09-24 DIAGNOSIS — F419 Anxiety disorder, unspecified: Secondary | ICD-10-CM | POA: Diagnosis present

## 2021-09-24 DIAGNOSIS — R531 Weakness: Secondary | ICD-10-CM

## 2021-09-24 DIAGNOSIS — R52 Pain, unspecified: Secondary | ICD-10-CM

## 2021-09-24 DIAGNOSIS — Z91040 Latex allergy status: Secondary | ICD-10-CM

## 2021-09-24 DIAGNOSIS — Z79891 Long term (current) use of opiate analgesic: Secondary | ICD-10-CM

## 2021-09-24 DIAGNOSIS — K640 First degree hemorrhoids: Secondary | ICD-10-CM | POA: Diagnosis present

## 2021-09-24 DIAGNOSIS — N1831 Chronic kidney disease, stage 3a: Secondary | ICD-10-CM | POA: Diagnosis present

## 2021-09-24 LAB — BASIC METABOLIC PANEL
Anion gap: 9 (ref 5–15)
BUN: 24 mg/dL — ABNORMAL HIGH (ref 8–23)
CO2: 19 mmol/L — ABNORMAL LOW (ref 22–32)
Calcium: 8.8 mg/dL — ABNORMAL LOW (ref 8.9–10.3)
Chloride: 107 mmol/L (ref 98–111)
Creatinine, Ser: 2.3 mg/dL — ABNORMAL HIGH (ref 0.44–1.00)
GFR, Estimated: 23 mL/min — ABNORMAL LOW (ref >60)
Glucose, Bld: 97 mg/dL (ref 70–99)
Potassium: 4.5 mmol/L (ref 3.5–5.1)
Sodium: 135 mmol/L (ref 135–145)

## 2021-09-24 LAB — IRON AND TIBC
Iron: 34 ug/dL (ref 28–170)
Saturation Ratios: 10 % — ABNORMAL LOW (ref 10.4–31.8)
TIBC: 329 ug/dL (ref 250–450)
UIBC: 295 ug/dL

## 2021-09-24 LAB — ABO/RH: ABO/RH(D): A POS

## 2021-09-24 LAB — CBC
HCT: 24 % — ABNORMAL LOW (ref 36.0–46.0)
Hemoglobin: 7.3 g/dL — ABNORMAL LOW (ref 12.0–15.0)
MCH: 26 pg (ref 26.0–34.0)
MCHC: 30.4 g/dL (ref 30.0–36.0)
MCV: 85.4 fL (ref 80.0–100.0)
Platelets: 357 10*3/uL (ref 150–400)
RBC: 2.81 MIL/uL — ABNORMAL LOW (ref 3.87–5.11)
RDW: 17.6 % — ABNORMAL HIGH (ref 11.5–15.5)
WBC: 9 10*3/uL (ref 4.0–10.5)
nRBC: 0 % (ref 0.0–0.2)

## 2021-09-24 LAB — RETICULOCYTES
Immature Retic Fract: 34.7 % — ABNORMAL HIGH (ref 2.3–15.9)
RBC.: 2.84 MIL/uL — ABNORMAL LOW (ref 3.87–5.11)
Retic Count, Absolute: 175.8 10*3/uL (ref 19.0–186.0)
Retic Ct Pct: 6.3 % — ABNORMAL HIGH (ref 0.4–3.1)

## 2021-09-24 LAB — PROTIME-INR
INR: 1 (ref 0.8–1.2)
Prothrombin Time: 13.5 seconds (ref 11.4–15.2)

## 2021-09-24 LAB — PREPARE RBC (CROSSMATCH)

## 2021-09-24 MED ORDER — PANTOPRAZOLE SODIUM 40 MG PO TBEC
40.0000 mg | DELAYED_RELEASE_TABLET | Freq: Two times a day (BID) | ORAL | Status: DC
  Administered 2021-09-24 – 2021-10-01 (×14): 40 mg via ORAL
  Filled 2021-09-24 (×15): qty 1

## 2021-09-24 MED ORDER — SODIUM CHLORIDE 0.9 % IV SOLN
10.0000 mL/h | Freq: Once | INTRAVENOUS | Status: AC
Start: 2021-09-24 — End: 2021-09-26
  Administered 2021-09-26: 10 mL/h via INTRAVENOUS

## 2021-09-24 MED ORDER — AMLODIPINE BESYLATE 10 MG PO TABS
10.0000 mg | ORAL_TABLET | Freq: Every day | ORAL | Status: DC
  Administered 2021-09-25 – 2021-10-01 (×7): 10 mg via ORAL
  Filled 2021-09-24 (×7): qty 1

## 2021-09-24 MED ORDER — MELATONIN 5 MG PO TABS
10.0000 mg | ORAL_TABLET | Freq: Every evening | ORAL | Status: DC | PRN
  Administered 2021-09-26: 10 mg via ORAL
  Filled 2021-09-24: qty 2

## 2021-09-24 MED ORDER — ACETAMINOPHEN 325 MG PO TABS: 650.0000 mg | ORAL_TABLET | Freq: Four times a day (QID) | ORAL | Status: DC | PRN

## 2021-09-24 MED ORDER — LACTATED RINGERS IV SOLN: INTRAVENOUS | Status: DC

## 2021-09-24 MED ORDER — ONDANSETRON HCL 4 MG/2ML IJ SOLN: 4.0000 mg | Freq: Four times a day (QID) | INTRAMUSCULAR | Status: DC | PRN

## 2021-09-24 MED ORDER — ACETAMINOPHEN 650 MG RE SUPP: 650.0000 mg | Freq: Four times a day (QID) | RECTAL | Status: DC | PRN

## 2021-09-24 MED ORDER — SODIUM CHLORIDE 0.9 % IV BOLUS
500.0000 mL | Freq: Once | INTRAVENOUS | Status: AC
  Administered 2021-09-24: 500 mL via INTRAVENOUS

## 2021-09-24 MED ORDER — ONDANSETRON HCL 4 MG PO TABS: 4.0000 mg | ORAL_TABLET | Freq: Four times a day (QID) | ORAL | Status: DC | PRN

## 2021-09-24 NOTE — ED Provider Notes (Signed)
? ? ?[facility] ?Emergency Department Provider Note ? ? ? ? Event Date/Time  ? First MD Initiated Contact with Patient 09/24/21 1336   ?  (approximate) ? ? ?History  ? ?Weakness ? ? ?HPI ? ?Yolanda [name] is a 66 y.o. female with a history of bradycardia, COPD, HTN, PVD, and CVA presents via EMS from home. The patient complains of weakness, she just completed a course of antibiotics for a UTI. She has had decreased oral intake and was preparing to see the PCP this morning. The patient apparently had a syncopal episode prior to arrival.   ? ?  ? ? ?Physical Exam  ? ?Triage Vital Signs: ?ED Triage Vitals  ?Enc Vitals Group  ?   BP 09/24/21 1327 (!) 114/54  ?   Pulse Rate 09/24/21 1328 77  ?   Resp 09/24/21 1327 (!) 22  ?   Temp 09/24/21 1329 98.9 ?F (37.2 ?C)  ?   Temp Source 09/24/21 1329 Oral  ?   SpO2 09/24/21 1327 98 %  ?   Weight --   ?   Height --   ?   Head Circumference --   ?   Peak Flow --   ?   Pain Score 09/24/21 1324 0  ?   Pain Loc --   ?   Pain Edu? --   ?   Excl. in GC? --   ? ? ?Most recent vital signs: ?Vitals:  ? 09/24/21 1930 09/24/21 2015  ?BP: (!) 150/84 (!) 129/116  ?Pulse: 75 82  ?Resp: 16 16  ?Temp:    ?SpO2: 100% 100%  ? ? ?General Awake, no distress.  ?HEENT NCAT. PERRL. EOMI. No rhinorrhea. Mucous membranes are moist.  ?CV:  Good peripheral perfusion. RRR ?RESP:  Normal effort. CTA ?ABD:  No distention. Soft, nontender. Heme negative stool ? ?ED Results / Procedures / Treatments  ? ?Labs ?(all labs ordered are listed, but only abnormal results are displayed) ?Labs Reviewed  ?BASIC METABOLIC PANEL - Abnormal; Notable for the following components:  ?    Result Value  ? CO2 19 (*)   ? BUN 24 (*)   ? Creatinine, Ser 2.30 (*)   ? Calcium 8.8 (*)   ? GFR, Estimated 23 (*)   ? All other components within normal limits  ?CBC - Abnormal; Notable for the following components:  ? RBC 2.81 (*)   ? Hemoglobin 7.3 (*)   ? HCT 24.0 (*)   ? RDW 17.6 (*)   ? All other components  within normal limits  ?RETICULOCYTES - Abnormal; Notable for the following components:  ? Retic Ct Pct 6.3 (*)   ? RBC. 2.84 (*)   ? Immature Retic Fract 34.7 (*)   ? All other components within normal limits  ?IRON AND TIBC - Abnormal; Notable for the following components:  ? Saturation Ratios 10 (*)   ? All other components within normal limits  ?RESP PANEL BY RT-PCR (FLU A&B, COVID) ARPGX2  ?PROTIME-INR  ?URINALYSIS, ROUTINE W REFLEX MICROSCOPIC  ?CBG MONITORING, ED  ?TYPE AND SCREEN  ?PREPARE RBC (CROSSMATCH)  ?ABO/RH  ? ? ? ?EKG ? ? ? ?RADIOLOGY ? ?I personally viewed and evaluated these images as part of my medical decision making, as well as reviewing the written report by the radiologist. ? ?ED Provider Interpretation: agree with report} ? ?CT HEAD WO CONTRAST ( ) ? ?Result Date: 09/24/2021 ?CLINICAL DATA:  Syncope. EXAM: CT HEAD WITHOUT CONTRAST TECHNIQUE:  Contiguous axial images were obtained from the base of the skull through the vertex without intravenous contrast. RADIATION DOSE REDUCTION: This exam was performed according to the departmental dose-optimization program which includes automated exposure control, adjustment of the mA and/or kV according to patient size and/or use of iterative reconstruction technique. COMPARISON:  September 20, 2014. FINDINGS: Brain: Right frontal ventriculostomy is noted with distal tip passing through left frontal horn and tip in left basal ganglia. No ventricular dilatation is noted. Aneurysm clip is noted in left basal ganglia region. No mass effect or midline shift is noted. No acute infarction, hemorrhage or mass lesion is noted. Vascular: No hyperdense vessel or unexpected calcification. Skull: Status post right temporal craniotomy. Right frontal ventriculostomy is noted. Sinuses/Orbits: No acute finding. Other: None. IMPRESSION: Right frontal ventriculostomy catheter as described above. No ventricular dilatation is noted. No acute intracranial abnormality seen.  Electronically Signed   By: Lupita RaiderJames  Green Jr M.D.   On: 09/24/2021 16:17   ? ? ?PROCEDURES: ? ?Critical Care performed: Yes, see critical care procedure note(s) ? ?Procedures ? ?CRITICAL CARE ?Performed by: Lissa HoardJenise V Bacon-Quyen Cutsforth ? ? ?Total critical care time: 20 minutes ? ?Critical care time was exclusive of separately billable procedures and treating other patients. ? ?Critical care was necessary to treat or prevent imminent or life-threatening deterioration. ? ?Critical care was time spent personally by me on the following activities: development of treatment plan with patient and/or surrogate as well as nursing, discussions with consultants, evaluation of patient's response to treatment, examination of patient, obtaining history from patient or surrogate, ordering and performing treatments and interventions, ordering and review of laboratory studies, ordering and review of radiographic studies, pulse oximetry and re-evaluation of patient's condition. ? ? ?MEDICATIONS ORDERED IN ED: ?Medications  ?0.9 %  sodium chloride infusion (has no administration in time range)  ?lactated ringers infusion (has no administration in time range)  ?sodium chloride 0.9 % bolus 500 mL (500 mLs Intravenous New Bag/Given 09/24/21 1605)  ? ? ? ?IMPRESSION / MDM / ASSESSMENT AND PLAN / ED COURSE  ?I reviewed the triage vital signs and the nursing notes. ?             ?               ? ?Differential diagnosis includes, but is not limited to, alcohol, illicit or prescription medications, or other toxic ingestion; intracranial pathology such as stroke or intracerebral hemorrhage; fever or infectious causes including sepsis; hypoxemia and/or hypercarbia; uremia; trauma; endocrine related disorders such as diabetes, hypoglycemia, and thyroid-related diseases; hypertensive encephalopathy; etc. ? ? ?The patient is on the cardiac monitor to evaluate for evidence of arrhythmia and/or significant heart rate changes. ? ?Geriatric patient with ED  evaluation of concern for AMS and syncopal episode. Patient was evaluated and found to have a significant anemia. Her CBC last month, on chart review was WNL. Patient's other work-up reveals an AKI and since symptomatic, warrants emergent blood transfusion. The remainder of her work-up is normal, including  CT. Patient's daughter is at bedside and consents to admission. diagnosis is consistent with critical symptomatic anemia and AKI. Patient will be admitted to the hospital service for further treatment. ? ?Dr. Carollee HerterEric Chen will admit the patient to the hospitalist service.  ? ?FINAL CLINICAL IMPRESSION(S) / ED DIAGNOSES  ? ?Final diagnoses:  ?Symptomatic anemia  ?Weakness  ?AKI (acute kidney injury) (HCC)  ? ? ? ?Rx / DC Orders  ? ?ED Discharge Orders   ? ? None  ? ?  ? ? ? ?  Note:  This document was prepared using Dragon voice recognition software and may include unintentional dictation errors. ? ?  ?Lissa Hoard, PA-C ?09/24/21 2126 ? ?  ?Sharman Cheek, MD ?09/26/21 1511 ? ?

## 2021-09-24 NOTE — Assessment & Plan Note (Signed)
Stable

## 2021-09-24 NOTE — Assessment & Plan Note (Signed)
Continue Aricept 

## 2021-09-24 NOTE — Assessment & Plan Note (Addendum)
Slightly worse. Scr up to 2.3.  Gentle IVF.  ?

## 2021-09-24 NOTE — ED Notes (Signed)
3 RNs attempted blood draw and IV access without success.  ? ?

## 2021-09-24 NOTE — ED Notes (Signed)
Pt blood transfusion had to be stopped as lost IV access  IV team re paged to assess or replace IV and order placed as STAT as blood is already hanging.  ? ?

## 2021-09-24 NOTE — Assessment & Plan Note (Signed)
Mild AKI. Gentle IVF. PRBC should help. Hold nephrotoxic drugs. ?

## 2021-09-24 NOTE — ED Triage Notes (Signed)
BIB EMS from home.  Complaint of weakness.  Just finished anbx for UTI at the first of the week.   States she hasn't been eating or drinking normally.  Family was trying to take her to the doctor this morning and had syncopal episode.  ?

## 2021-09-24 NOTE — H&P (Addendum)
History and Physical    Yolanda [name] [id number] DOB: 1956/06/16 DOA: 09/24/2021  DOS: the patient was seen and examined on 09/24/2021  PCP: Lorna Few, DO   Patient coming from: Home  I have personally briefly reviewed patient's old medical records in [place]  CC: syncope, fatigue HPI: 66 year old African-American female with a history of stroke with left-sided hemiplegia, history of VP shunt, hypertension, COPD, CKD stage IIIa, who lives at home alone.  Patient reportedly has had fatigue for unknown duration of time.  Patient has a very poor memory.  Based upon labs, she had a CBC performed last month in February 2023 with a hemoglobin of 14.1 g/dL.  Today was checked in the ER and was 7.3.  Patient denies having any rectal bleeding, black stools, melena.  Family is not at bedside.  Unable to corroborate any history.  When asked any questions about her condition, she states ask my daughter.  [facility] hospitalist contacted for admission due to symptomatic anemia.   ED Course: HgB 7.1 g/dl.  Transfusion ordered.  EDP to perform rectal exam for Hemoccult.  Review of Systems:  Review of Systems  Unable to perform ROS: Dementia   Past Medical History:  Diagnosis Date   ALLERGIC RHINITIS 10/25/2007   ANXIETY 10/25/2007   B12 DEFICIENCY 12/01/2009   BACK PAIN 10/25/2007   BRADYCARDIA 11/19/2009   CEREBROVASCULAR ACCIDENT, HX OF 10/25/2007   CHEST PAIN 12/31/2007   COPD 10/25/2007   DEPRESSION 10/25/2007   FATIGUE 10/25/2007   GERD 10/25/2007   Headache(784.0) 10/25/2007   HEMIPARESIS, LEFT 11/23/2009   HYPERLIPIDEMIA 10/25/2007   HYPERTENSION 10/25/2007   INSOMNIA-SLEEP DISORDER-UNSPEC 10/25/2007   MITRAL INSUFFICIENCY 10/25/2007   OSTEOPENIA 11/19/2009   OSTEOPOROSIS 10/25/2007   PERIPHERAL VASCULAR DISEASE 10/25/2007   Unspecified visual loss 11/23/2009   VITAMIN D DEFICIENCY 06/03/2010    Past Surgical History:  Procedure Laterality Date   CHOLECYSTECTOMY     TUBAL LIGATION        reports that she has quit smoking. She has never used smokeless tobacco. She reports current alcohol use. She reports that she does not use drugs.  Allergies  Allergen Reactions   Latex Swelling    Lip swelled after having hair colored by person wearing latex gloves   Ace Inhibitors     Family History  Problem Relation Age of Onset   Alcohol abuse Other        ETOH Dependence   Arthritis Other    Heart disease Other    Hypertension Other    Stroke Other    Depression Other     Prior to Admission medications   Medication Sig Start Date End Date Taking? Authorizing Provider  alendronate (FOSAMAX) 70 MG tablet TAKE 1 TABLET BY MOUTH ONCE A WEEK WITH GLASS OF WATER ON AN EMPTY STOMACH 05/25/15   Biagio Borg, MD  amLODipine (NORVASC) 10 MG tablet Take 1 tablet (10 mg total) by mouth daily. 12/03/14   Biagio Borg, MD  aspirin EC 81 MG tablet Take 81 mg by mouth daily. Swallow whole.    [provider]  Calcium Carbonate (CALCIUM 500 PO) Take by mouth 2 (two) times daily.   Patient not taking: Reported on 12/31/2019    [provider]  cholecalciferol (VITAMIN D) 1000 UNITS tablet Take 1,000 Units by mouth daily.   Patient not taking: Reported on 12/31/2019    [provider]  cloNIDine (CATAPRES) 0.1 MG tablet Take 1 tablet (0.1  mg total) by mouth 2 (two) times daily. Patient not taking: Reported on 12/31/2019 12/05/14   Biagio Borg, MD  donepezil (ARICEPT) 5 MG tablet Take 1 tablet (5 mg total) by mouth at bedtime. Patient not taking: Reported on 12/31/2019 12/03/14   Biagio Borg, MD  ferrous sulfate 325 (65 FE) MG tablet Take 325 mg by mouth. 1 by mouth daily for 3 months  Patient not taking: Reported on 12/31/2019    [provider]  Folic Acid-Vit [id number] [id number] (FOLBEE) 2.5-25-1 MG TABS tablet Take 1 tablet by mouth daily. Patient not taking: Reported on 12/31/2019 12/03/14   Biagio Borg, MD  haloperidol (HALDOL) 5 MG tablet 1/2 tab by mouth every 6  hrs as needed for agitation or psychosis Patient not taking: Reported on 12/31/2019 12/03/14   Biagio Borg, MD  hydrALAZINE (APRESOLINE) 100 MG tablet Take by mouth. 08/15/19 08/14/20  [provider]  lovastatin (MEVACOR) 20 MG tablet 3 tabs by mouth per day 12/03/14   Biagio Borg, MD  Melatonin-Pyridoxine 3-1 MG TABS Take by mouth.    [provider]  Multiple Vitamin (MULTIVITAMIN) tablet Take 1 tablet by mouth daily.   Patient not taking: Reported on 12/31/2019    [provider]  oxyCODONE (OXY IR/ROXICODONE) 5 MG immediate release tablet Take 1 tablet (5 mg total) by mouth every 6 (six) hours as needed for severe pain. Patient not taking: Reported on 12/31/2019 12/03/14   Biagio Borg, MD  pantoprazole (PROTONIX) 40 MG tablet Take 1 tablet (40 mg total) by mouth daily. Patient not taking: Reported on 12/31/2019 12/03/14   Biagio Borg, MD  potassium chloride SA (KLOR-CON) 20 MEQ tablet Take by mouth. 12/26/19 12/25/20  [provider]  QUEtiapine (SEROQUEL) 25 MG tablet Take 1 and 1?2 tab by mouth at bedtime Patient not taking: Reported on 12/31/2019 01/23/15   Biagio Borg, MD  senna (SENOKOT) 8.6 MG tablet Take 1 tablet by mouth daily. Patient not taking: Reported on 12/31/2019    [provider]  sodium chloride (OCEAN) 0.65 % SOLN nasal spray Place 1 spray into both nostrils as needed for congestion. Patient not taking: Reported on 12/31/2019    [provider]  spironolactone (ALDACTONE) 25 MG tablet Take 1 tablet (25 mg total) by mouth daily. 12/03/14   Biagio Borg, MD  traZODone (DESYREL) 50 MG tablet Take 3/4 tab by mouth at bedtime 03/31/15   Biagio Borg, MD  vitamin C (ASCORBIC ACID) 500 MG tablet Take 500 mg by mouth daily. Patient not taking: Reported on 12/31/2019    [provider]    Physical Exam: Vitals:   09/24/21 1800 09/24/21 1830 09/24/21 1837 09/24/21 1837  BP: 119/79 (!) 126/100  (!) 126/100  Pulse: 76 73   72  Resp:   20 20  Temp:   98.4 F (36.9 C) 98.4 F (36.9 C)  TempSrc:    Oral  SpO2: 100% 100%  100%    Physical Exam Vitals and nursing note reviewed.  Constitutional:      General: She is not in acute distress.    Appearance: Normal appearance. She is normal weight. She is not ill-appearing, toxic-appearing or diaphoretic.  HENT:     Head: Normocephalic and atraumatic.     Nose: Nose normal. No rhinorrhea.  Cardiovascular:     Rate and Rhythm: Normal rate and regular rhythm.     Pulses: Normal pulses.  Pulmonary:  Effort: Pulmonary effort is normal. No respiratory distress.     Breath sounds: Normal breath sounds. No wheezing or rales.  Abdominal:     General: Abdomen is flat. Bowel sounds are normal. There is no distension.     Tenderness: There is no abdominal tenderness. There is no guarding.  Musculoskeletal:     Right lower leg: No edema.     Left lower leg: No edema.  Skin:    General: Skin is warm and dry.     Capillary Refill: Capillary refill takes less than 2 seconds.  Neurological:     General: No focal deficit present.     Mental Status: She is alert. She is disoriented.     Labs on Admission: I have personally reviewed following labs and imaging studies  CBC: Recent Labs  Lab 09/24/21 1326  WBC 9.0  HGB 7.3*  HCT 24.0*  MCV 85.4  PLT [id number]   Basic Metabolic Panel: Recent Labs  Lab 09/24/21 1326  NA 135  K 4.5  CL 107  CO2 19*  GLUCOSE 97  BUN 24*  CREATININE 2.30*  CALCIUM 8.8*   GFR: CrCl cannot be calculated (Unknown ideal weight.). Liver Function Tests: No results for input(s): AST, ALT, ALKPHOS, BILITOT, PROT, ALBUMIN in the last 168 hours. No results for input(s): LIPASE, AMYLASE in the last 168 hours. No results for input(s): AMMONIA in the last 168 hours. Coagulation Profile: Recent Labs  Lab 09/24/21 1326  INR 1.0   Cardiac Enzymes: No results for input(s): CKTOTAL, CKMB, CKMBINDEX, TROPONINI, TROPONINIHS in the last  168 hours. BNP (last 3 results) No results for input(s): PROBNP in the last 8760 hours. HbA1C: No results for input(s): HGBA1C in the last 72 hours. CBG: No results for input(s): GLUCAP in the last 168 hours. Lipid Profile: No results for input(s): CHOL, HDL, LDLCALC, TRIG, CHOLHDL, LDLDIRECT in the last 72 hours. Thyroid Function Tests: No results for input(s): TSH, T4TOTAL, FREET4, T3FREE, THYROIDAB in the last 72 hours. Anemia Panel: Recent Labs    09/24/21 1548 09/24/21 1602  TIBC  --  329  IRON  --  34  RETICCTPCT 6.3*  --    Urine analysis:    Component Value Date/Time   COLORURINE YELLOW 01/23/2015 1602   APPEARANCEUR CLEAR 01/23/2015 1602   LABSPEC 1.025 01/23/2015 1602   PHURINE 5.5 01/23/2015 1602   GLUCOSEU NEGATIVE 01/23/2015 1602   HGBUR NEGATIVE 01/23/2015 1602   BILIRUBINUR NEGATIVE 01/23/2015 1602   KETONESUR NEGATIVE 01/23/2015 1602   PROTEINUR NEGATIVE 12/19/2007 1806   UROBILINOGEN 1.0 01/23/2015 1602   NITRITE NEGATIVE 01/23/2015 1602   LEUKOCYTESUR NEGATIVE 01/23/2015 1602    Radiological Exams on Admission: I have personally reviewed images CT HEAD WO CONTRAST (5MM)  Result Date: 09/24/2021 CLINICAL DATA:  Syncope. EXAM: CT HEAD WITHOUT CONTRAST TECHNIQUE: Contiguous axial images were obtained from the base of the skull through the vertex without intravenous contrast. RADIATION DOSE REDUCTION: This exam was performed according to the departmental dose-optimization program which includes automated exposure control, adjustment of the mA and/or kV according to patient size and/or use of iterative reconstruction technique. COMPARISON:  September 20, 2014. FINDINGS: Brain: Right frontal ventriculostomy is noted with distal tip passing through left frontal horn and tip in left basal ganglia. No ventricular dilatation is noted. Aneurysm clip is noted in left basal ganglia region. No mass effect or midline shift is noted. No acute infarction, hemorrhage or mass lesion  is noted. Vascular: No hyperdense vessel or unexpected  calcification. Skull: Status post right temporal craniotomy. Right frontal ventriculostomy is noted. Sinuses/Orbits: No acute finding. Other: None. IMPRESSION: Right frontal ventriculostomy catheter as described above. No ventricular dilatation is noted. No acute intracranial abnormality seen. Electronically Signed   By: Marijo Conception M.D.   On: 09/24/2021 16:17    EKG: I have personally reviewed EKG: EKG unavailable for review  Assessment/Plan Principal Problem:   Symptomatic anemia Active Problems:   AKI (acute kidney injury) ([place])   Hemiplegia as late effect of cerebrovascular accident (CVA) ([place]) - left side hemiplegia   Essential hypertension   COPD (chronic obstructive pulmonary disease) (H[facility])   Stage 3a chronic kidney disease (CKD) (HCC) - baseline SCr 1.6   Dementia (HCC)    Assessment and Plan: * Symptomatic anemia Admit to observation medical bed.  Transfusion has been ordered by EDP.  Awaiting EDP to perform rectal exam for Hemoccult.  Iron and TIBC are not significant for iron deficiency anemia.  Patient does not know if she has ever had a colonoscopy before.  Patient may need outpatient colonoscopy.  Start p.o. Protonix for now.  Repeat hemoglobin after transfusion.  AKI (acute kidney injury) (HCC) Mild AKI. Gentle IVF. PRBC should help. Hold nephrotoxic drugs.  Dementia ([place]) Stable.  Stage 3a chronic kidney disease (CKD) (HCC) - baseline SCr 1.6 Slightly worse. Scr up to 2.3.  Gentle IVF.   Memory loss Continue Aricept  COPD (chronic obstructive pulmonary disease) (HCC) Stable.  Essential hypertension Stable  Hemiplegia as late effect of cerebrovascular accident (CVA) ([place]) - left side hemiplegia Chronic    DVT prophylaxis: SCDs Code Status: Full Code Family Communication: no family at bedside  Disposition Plan: return home. Pt states she lives alone  Consults called: none  Admission status:  Observation, Med-Surg   Kristopher Oppenheim, DO [facility] 09/24/2021, 7:56 PM

## 2021-09-24 NOTE — Assessment & Plan Note (Signed)
Admit to observation medical bed.  Transfusion has been ordered by EDP.  Awaiting EDP to perform rectal exam for Hemoccult.  Iron and TIBC are not significant for iron deficiency anemia.  Patient does not know if she has ever had a colonoscopy before.  Patient may need outpatient colonoscopy.  Start p.o. Protonix for now.  Repeat hemoglobin after transfusion. ?

## 2021-09-24 NOTE — Assessment & Plan Note (Signed)
Chronic. 

## 2021-09-24 NOTE — Subjective & Objective (Signed)
CC: syncope, fatigue ?HPI: ?66 year old African-American female with a history of stroke with left-sided hemiplegia, history of VP shunt, hypertension, COPD, CKD stage IIIa, who lives at home alone.  Patient reportedly has had fatigue for unknown duration of time.  Patient has a very poor memory. ? ?Based upon labs, she had a CBC performed last month in February 2023 with a hemoglobin of 14.1 g/dL.  Today was checked in the ER and was 7.3.  Patient denies having any rectal bleeding, black stools, melena. ? ?Family is not at bedside.  Unable to corroborate any history. ? ?When asked any questions about her condition, she states ask my daughter. ? ?[facility] hospitalist contacted for admission due to symptomatic anemia. ?

## 2021-09-24 NOTE — ED Notes (Signed)
Called lab to assist in obtaining type and screen with permission from charge RN Stephaine ? ?

## 2021-09-25 ENCOUNTER — Observation Stay: Payer: Medicare Other

## 2021-09-25 DIAGNOSIS — D649 Anemia, unspecified: Secondary | ICD-10-CM

## 2021-09-25 DIAGNOSIS — Z20822 Contact with and (suspected) exposure to covid-19: Secondary | ICD-10-CM | POA: Diagnosis present

## 2021-09-25 DIAGNOSIS — N1831 Chronic kidney disease, stage 3a: Secondary | ICD-10-CM | POA: Diagnosis present

## 2021-09-25 DIAGNOSIS — E559 Vitamin D deficiency, unspecified: Secondary | ICD-10-CM | POA: Diagnosis present

## 2021-09-25 DIAGNOSIS — D519 Vitamin B12 deficiency anemia, unspecified: Secondary | ICD-10-CM | POA: Diagnosis present

## 2021-09-25 DIAGNOSIS — J309 Allergic rhinitis, unspecified: Secondary | ICD-10-CM | POA: Diagnosis present

## 2021-09-25 DIAGNOSIS — I69354 Hemiplegia and hemiparesis following cerebral infarction affecting left non-dominant side: Secondary | ICD-10-CM | POA: Diagnosis not present

## 2021-09-25 DIAGNOSIS — G4700 Insomnia, unspecified: Secondary | ICD-10-CM | POA: Diagnosis present

## 2021-09-25 DIAGNOSIS — Z9049 Acquired absence of other specified parts of digestive tract: Secondary | ICD-10-CM | POA: Diagnosis not present

## 2021-09-25 DIAGNOSIS — K640 First degree hemorrhoids: Secondary | ICD-10-CM | POA: Diagnosis present

## 2021-09-25 DIAGNOSIS — R55 Syncope and collapse: Secondary | ICD-10-CM | POA: Diagnosis present

## 2021-09-25 DIAGNOSIS — F0390 Unspecified dementia without behavioral disturbance: Secondary | ICD-10-CM | POA: Diagnosis present

## 2021-09-25 DIAGNOSIS — F419 Anxiety disorder, unspecified: Secondary | ICD-10-CM | POA: Diagnosis present

## 2021-09-25 DIAGNOSIS — K219 Gastro-esophageal reflux disease without esophagitis: Secondary | ICD-10-CM | POA: Diagnosis present

## 2021-09-25 DIAGNOSIS — Z982 Presence of cerebrospinal fluid drainage device: Secondary | ICD-10-CM | POA: Diagnosis not present

## 2021-09-25 DIAGNOSIS — D509 Iron deficiency anemia, unspecified: Secondary | ICD-10-CM | POA: Diagnosis present

## 2021-09-25 DIAGNOSIS — I129 Hypertensive chronic kidney disease with stage 1 through stage 4 chronic kidney disease, or unspecified chronic kidney disease: Secondary | ICD-10-CM | POA: Diagnosis present

## 2021-09-25 DIAGNOSIS — I739 Peripheral vascular disease, unspecified: Secondary | ICD-10-CM | POA: Diagnosis present

## 2021-09-25 DIAGNOSIS — N179 Acute kidney failure, unspecified: Secondary | ICD-10-CM | POA: Diagnosis present

## 2021-09-25 DIAGNOSIS — F0394 Unspecified dementia, unspecified severity, with anxiety: Secondary | ICD-10-CM | POA: Diagnosis present

## 2021-09-25 DIAGNOSIS — K317 Polyp of stomach and duodenum: Secondary | ICD-10-CM | POA: Diagnosis present

## 2021-09-25 DIAGNOSIS — J449 Chronic obstructive pulmonary disease, unspecified: Secondary | ICD-10-CM | POA: Diagnosis present

## 2021-09-25 DIAGNOSIS — M810 Age-related osteoporosis without current pathological fracture: Secondary | ICD-10-CM | POA: Diagnosis present

## 2021-09-25 DIAGNOSIS — F32A Depression, unspecified: Secondary | ICD-10-CM | POA: Diagnosis present

## 2021-09-25 DIAGNOSIS — E785 Hyperlipidemia, unspecified: Secondary | ICD-10-CM | POA: Diagnosis present

## 2021-09-25 DIAGNOSIS — K635 Polyp of colon: Secondary | ICD-10-CM | POA: Diagnosis present

## 2021-09-25 DIAGNOSIS — Z8249 Family history of ischemic heart disease and other diseases of the circulatory system: Secondary | ICD-10-CM | POA: Diagnosis not present

## 2021-09-25 DIAGNOSIS — R531 Weakness: Secondary | ICD-10-CM | POA: Diagnosis present

## 2021-09-25 LAB — TYPE AND SCREEN
ABO/RH(D): A POS
Antibody Screen: NEGATIVE
Unit division: 0

## 2021-09-25 LAB — COMPREHENSIVE METABOLIC PANEL
ALT: 7 U/L (ref 0–44)
AST: 7 U/L — ABNORMAL LOW (ref 15–41)
Albumin: 2.7 g/dL — ABNORMAL LOW (ref 3.5–5.0)
Alkaline Phosphatase: 44 U/L (ref 38–126)
Anion gap: 6 (ref 5–15)
BUN: 25 mg/dL — ABNORMAL HIGH (ref 8–23)
CO2: 19 mmol/L — ABNORMAL LOW (ref 22–32)
Calcium: 8 mg/dL — ABNORMAL LOW (ref 8.9–10.3)
Chloride: 107 mmol/L (ref 98–111)
Creatinine, Ser: 2.04 mg/dL — ABNORMAL HIGH (ref 0.44–1.00)
GFR, Estimated: 26 mL/min — ABNORMAL LOW (ref >60)
Glucose, Bld: 100 mg/dL — ABNORMAL HIGH (ref 70–99)
Potassium: 4.2 mmol/L (ref 3.5–5.1)
Sodium: 132 mmol/L — ABNORMAL LOW (ref 135–145)
Total Bilirubin: 1.7 mg/dL — ABNORMAL HIGH (ref 0.3–1.2)
Total Protein: 5.8 g/dL — ABNORMAL LOW (ref 6.5–8.1)

## 2021-09-25 LAB — CBC
HCT: 26.6 % — ABNORMAL LOW (ref 36.0–46.0)
Hemoglobin: 8.5 g/dL — ABNORMAL LOW (ref 12.0–15.0)
MCH: 26.8 pg (ref 26.0–34.0)
MCHC: 32 g/dL (ref 30.0–36.0)
MCV: 83.9 fL (ref 80.0–100.0)
Platelets: 313 10*3/uL (ref 150–400)
RBC: 3.17 MIL/uL — ABNORMAL LOW (ref 3.87–5.11)
RDW: 16.9 % — ABNORMAL HIGH (ref 11.5–15.5)
WBC: 6.8 10*3/uL (ref 4.0–10.5)
nRBC: 0.3 % — ABNORMAL HIGH (ref 0.0–0.2)

## 2021-09-25 LAB — CBC WITH DIFFERENTIAL/PLATELET
Abs Immature Granulocytes: 0.05 10*3/uL (ref 0.00–0.07)
Basophils Absolute: 0 10*3/uL (ref 0.0–0.1)
Basophils Relative: 0 %
Eosinophils Absolute: 0.1 10*3/uL (ref 0.0–0.5)
Eosinophils Relative: 1 %
HCT: 24.4 % — ABNORMAL LOW (ref 36.0–46.0)
Hemoglobin: 7.7 g/dL — ABNORMAL LOW (ref 12.0–15.0)
Immature Granulocytes: 1 %
Lymphocytes Relative: 23 %
Lymphs Abs: 1.3 10*3/uL (ref 0.7–4.0)
MCH: 26.6 pg (ref 26.0–34.0)
MCHC: 31.6 g/dL (ref 30.0–36.0)
MCV: 84.1 fL (ref 80.0–100.0)
Monocytes Absolute: 0.4 10*3/uL (ref 0.1–1.0)
Monocytes Relative: 8 %
Neutro Abs: 3.8 10*3/uL (ref 1.7–7.7)
Neutrophils Relative %: 67 %
Platelets: 260 10*3/uL (ref 150–400)
RBC: 2.9 MIL/uL — ABNORMAL LOW (ref 3.87–5.11)
RDW: 16.6 % — ABNORMAL HIGH (ref 11.5–15.5)
WBC: 5.7 10*3/uL (ref 4.0–10.5)
nRBC: 0 % (ref 0.0–0.2)

## 2021-09-25 LAB — URINALYSIS, ROUTINE W REFLEX MICROSCOPIC
Bilirubin Urine: NEGATIVE
Glucose, UA: NEGATIVE mg/dL
Hgb urine dipstick: NEGATIVE
Ketones, ur: NEGATIVE mg/dL
Nitrite: NEGATIVE
Protein, ur: NEGATIVE mg/dL
Specific Gravity, Urine: 1.012 (ref 1.005–1.030)
pH: 5 (ref 5.0–8.0)

## 2021-09-25 LAB — PROTIME-INR
INR: 1.1 (ref 0.8–1.2)
Prothrombin Time: 13.9 seconds (ref 11.4–15.2)

## 2021-09-25 LAB — RESP PANEL BY RT-PCR (FLU A&B, COVID) ARPGX2
Influenza A by PCR: NEGATIVE
Influenza B by PCR: NEGATIVE
SARS Coronavirus 2 by RT PCR: NEGATIVE

## 2021-09-25 LAB — BPAM RBC
Blood Product Expiration Date: 202304042359
ISSUE DATE / TIME: 202303101822
Unit Type and Rh: 6200

## 2021-09-25 LAB — HIV ANTIBODY (ROUTINE TESTING W REFLEX): HIV Screen 4th Generation wRfx: NONREACTIVE

## 2021-09-25 LAB — MAGNESIUM: Magnesium: 2.1 mg/dL (ref 1.7–2.4)

## 2021-09-25 LAB — LACTATE DEHYDROGENASE: LDH: 145 U/L (ref 98–192)

## 2021-09-25 LAB — HEMOGLOBIN
Hemoglobin: 8.8 g/dL — ABNORMAL LOW (ref 12.0–15.0)
Hemoglobin: 8.4 g/dL — ABNORMAL LOW (ref 12.0–15.0)

## 2021-09-25 LAB — IRON AND TIBC
Iron: 67 ug/dL (ref 28–170)
Saturation Ratios: 21 % (ref 10.4–31.8)
TIBC: 314 ug/dL (ref 250–450)
UIBC: 247 ug/dL

## 2021-09-25 LAB — FOLATE: Folate: 8.2 ng/mL (ref >5.9)

## 2021-09-25 LAB — VITAMIN B12: Vitamin B-12: 115 pg/mL — ABNORMAL LOW (ref 180–914)

## 2021-09-25 LAB — FERRITIN: Ferritin: 31 ng/mL (ref 11–307)

## 2021-09-25 LAB — APTT: aPTT: 25 seconds (ref 24–36)

## 2021-09-25 NOTE — Progress Notes (Signed)
?PROGRESS NOTE ? ? ? ?Yolanda [name]  FTD:[id number] DOB: 1956/05/20 DOA: 09/24/2021 ?PCP: Araceli Bouche, DO  ? ? ?Brief Narrative:  ?66 year old African-American female with a history of stroke with left-sided hemiplegia, history of VP shunt, hypertension, COPD, CKD stage IIIa, who lives at home alone.  Patient reportedly has had fatigue for unknown duration of time.  Patient has a very poor memory. ? ?Based upon labs, she had a CBC performed last month in February 2023 with a hemoglobin of 14.1 g/dL.  Today was checked in the ER and was 7.3.  Patient denies having any rectal bleeding, black stools, melena. ? ?Patient is a poor historian due to her history of prior strokes and aneurysms.  She does have history of cognitive decline/dementia.  History was obtained by speaking with the patient's daughter at bedside.  They are unaware of any rectal bleeding or black stools however the patient lives alone and the exact nature of her bowel movements is unclear. ? ? ?Assessment & Plan: ?  ?Principal Problem: ?  Symptomatic anemia ?Active Problems: ?  AKI (acute kidney injury) (HCC) ?  Hemiplegia as late effect of cerebrovascular accident (CVA) (HCC) - left side hemiplegia ?  Essential hypertension ?  COPD (chronic obstructive pulmonary disease) (HCC) ?  Stage 3a chronic kidney disease (CKD) (HCC) - baseline SCr 1.6 ?  Dementia (HCC) ? ?Acute symptomatic anemia ?Syncope ?Patient has had seven-point drop in hemoglobin over the past month ?Unclear nature of bowel movements that time ?No known history of bleeding disorder ?Only antiplatelet agent is aspirin, no blood thinners ?Plan: ?Anemia labs including iron, TIBC, ferritin, B12, folate, haptoglobin, LDH ?Continue p.o. Protonix ?Check CT abdomen pelvis without contrast ?Trend hemoglobin, transfuse as needed less than 7 ?Fall precautions ?Therapy evaluations when able ? ?AKI on CKD stage IIIa ?Baseline creatinine 1.6 ?On gentle intravenous fluids ?Trend renal  function ? ?Dementia/cognitive decline ?History of CVA ?History of VP shunt ?Appears stable for now ?Mental status at baseline ?Plan: ?Holding aspirin ?Continue home Aricept ? ?COPD ?Appears stable, on room air ?No evidence of exacerbation ?Vitals per unit protocol ? ?Essential hypertension ?Blood pressure currently controlled ? ? ?DVT prophylaxis: SCDs ?Code Status: Full ?Family Communication: Daughter Maryelizabeth Kaufmann at bedside 3/11, daughter Coralee North via phone 3/11 ?Disposition Plan: Status is: Observation ?The patient will require care spanning > 2 midnights and should be moved to inpatient because: Acute on chronic symptomatic anemia requiring transfusion ? ? ?Level of care: Med-Surg ? ?Consultants:  ?None ? ?Procedures:  ?None ? ?Antimicrobials: ?None ? ? ?Subjective: ?Seen and examined.  Resting comfortably in bed.  No visible distress.  No pain complaints. ? ?Objective: ?Vitals:  ? 09/24/21 2315 09/25/21 0430 09/25/21 0443 09/25/21 0759  ?BP: 125/70 126/72 124/74 (!) 114/97  ?Pulse: 71 65 66 65  ?Resp: 18 20 18 18   ?Temp: 98.8 ?F (37.1 ?C) 98.7 ?F (37.1 ?C) 98.7 ?F (37.1 ?C) 97.7 ?F (36.5 ?C)  ?TempSrc:  Oral  Oral  ?SpO2: 100% 100% 100% 100%  ? ? ?Intake/Output Summary (Last 24 hours) at 09/25/2021 1042 ?Last data filed at 09/25/2021 1012 ?Gross per 24 hour  ?Intake 600 ml  ?Output 200 ml  ?Net 400 ml  ? ?There were no vitals filed for this visit. ? ?Examination: ? ?General exam: No acute distress ?Respiratory system: Lungs clear.  Normal work of breathing.  Room air ?Cardiovascular system: S1-S2, RRR, no murmurs, no pedal edema ?Gastrointestinal system: Soft, NT/ND, normal bowel sounds ?Central nervous system: Alert, oriented x2,  no focal deficits ?Extremities: Symmetric 5 x 5 power. ?Skin: No rashes, lesions or ulcers ?Psychiatry: Judgement and insight appear normal. Mood & affect appropriate.  ? ? ? ?Data Reviewed: I have personally reviewed following labs and imaging studies ? ?CBC: ?Recent Labs  ?Lab 09/24/21 ?1326  09/24/21 ?2331 09/25/21 ?0432  ?WBC 9.0 6.8 5.7  ?NEUTROABS  --   --  3.8  ?HGB 7.3* 8.5* 7.7*  ?HCT 24.0* 26.6* 24.4*  ?MCV 85.4 83.9 84.1  ?PLT 357 313 260  ? ?Basic Metabolic Panel: ?Recent Labs  ?Lab 09/24/21 ?1326 09/25/21 ?0432  ?NA 135 132*  ?K 4.5 4.2  ?CL 107 107  ?CO2 19* 19*  ?GLUCOSE 97 100*  ?BUN 24* 25*  ?CREATININE 2.30* 2.04*  ?CALCIUM 8.8* 8.0*  ?MG  --  2.1  ? ?GFR: ?CrCl cannot be calculated (Unknown ideal weight.). ?Liver Function Tests: ?Recent Labs  ?Lab 09/25/21 ?[id number]  ?AST 7*  ?ALT 7  ?ALKPHOS 44  ?BILITOT 1.7*  ?PROT 5.8*  ?ALBUMIN 2.7*  ? ?No results for input(s): LIPASE, AMYLASE in the last 168 hours. ?No results for input(s): AMMONIA in the last 168 hours. ?Coagulation Profile: ?Recent Labs  ?Lab 09/24/21 ?1326  ?INR 1.0  ? ?Cardiac Enzymes: ?No results for input(s): CKTOTAL, CKMB, CKMBINDEX, TROPONINI in the last 168 hours. ?BNP (last 3 results) ?No results for input(s): PROBNP in the last 8760 hours. ?HbA1C: ?No results for input(s): HGBA1C in the last 72 hours. ?CBG: ?No results for input(s): GLUCAP in the last 168 hours. ?Lipid Profile: ?No results for input(s): CHOL, HDL, LDLCALC, TRIG, CHOLHDL, LDLDIRECT in the last 72 hours. ?Thyroid Function Tests: ?No results for input(s): TSH, T4TOTAL, FREET4, T3FREE, THYROIDAB in the last 72 hours. ?Anemia Panel: ?Recent Labs  ?  09/24/21 ?1548 09/24/21 ?1602 09/25/21 ?0758  ?FOLATE  --   --  8.2  ?FERRITIN  --   --  31  ?TIBC  --  329 314  ?IRON  --  34 67  ?RETICCTPCT 6.3*  --   --   ? ?Sepsis Labs: ?No results for input(s): PROCALCITON, LATICACIDVEN in the last 168 hours. ? ?No results found for this or any previous visit (from the past 240 hour(s)).  ? ? ? ? ? ?Radiology Studies: ?CT HEAD WO CONTRAST (5MM) ? ?Result Date: 09/24/2021 ?CLINICAL DATA:  Syncope. EXAM: CT HEAD WITHOUT CONTRAST TECHNIQUE: Contiguous axial images were obtained from the base of the skull through the vertex without intravenous contrast. RADIATION DOSE REDUCTION: This  exam was performed according to the departmental dose-optimization program which includes automated exposure control, adjustment of the mA and/or kV according to patient size and/or use of iterative reconstruction technique. COMPARISON:  September 20, 2014. FINDINGS: Brain: Right frontal ventriculostomy is noted with distal tip passing through left frontal horn and tip in left basal ganglia. No ventricular dilatation is noted. Aneurysm clip is noted in left basal ganglia region. No mass effect or midline shift is noted. No acute infarction, hemorrhage or mass lesion is noted. Vascular: No hyperdense vessel or unexpected calcification. Skull: Status post right temporal craniotomy. Right frontal ventriculostomy is noted. Sinuses/Orbits: No acute finding. Other: None. IMPRESSION: Right frontal ventriculostomy catheter as described above. No ventricular dilatation is noted. No acute intracranial abnormality seen. Electronically Signed   By: Marijo Conception M.D.   On: 09/24/2021 16:17   ? ? ? ? ? ?Scheduled Meds: ? amLODipine  10 mg Oral Daily  ? pantoprazole  40 mg Oral BID  ? ?Continuous  Infusions: ? sodium chloride    ? lactated ringers 100 mL/hr at 09/25/21 1041  ? ? ? LOS: 0 days  ? ? ? ? ? ?Sidney Ace, MD ?[facility] ? ? ?If 7PM-7AM, please contact night-coverage ? ?09/25/2021, 10:42 AM  ? ?

## 2021-09-25 NOTE — Evaluation (Signed)
Occupational Therapy Evaluation Patient Details Name: Yolanda [name] MRN: [id number] DOB: 21-Feb-1956 Today's Date: 09/25/2021   History of Present Illness Pt is a 66 y/o F admitted on 09/24/21 after presenting with c/o syncope & fatigue. Pt noted to have Hgb of 7.3 down from 14.1 in Feb 2023. Pt with hx of cognitive decline/dementia. Pt is being treated for acute symptomatic anemia. PMH: stroke with L sided hemiplegia, VP shunt, HTN, COPD, CKD stage 3A   Clinical Impression   Ms. Ballengee was seen for OT/PT co-evaluation this date. Pt presents to OT services mildly confused and intermittently anxious t/o session. She is an unreliable historian 2/2 baseline diagnosis of dementia. Per pt daughter who PT contacted over the phone after session, pt lives at home with rotating personal care support from family and HH/aid. Per daughter, pt goes brief periods alone. Pt tells this author and PT that she enjoys getting outside and "using a miter saw" and that she is totally independent with ADL/IADL management. Per daughter pt requires assist with most BADL tasks and has been working with [facility] PT on using a rollator for improved safety. Hx of multiple falls in the last 6 months. Currently pt demonstrates impairments in strength, cognition, safety awareness, and balance which functionally limit her ability to perform ADL/self-care tasks. Pt currently requires MIN-MOD A for LB ADL management including bathing and dressing as well as MIN A for functional mobility, and SUPERVISION for UB ADL management while seated.  Pt would benefit from skilled OT services to address noted impairments and functional limitations (see below for any additional details) in order to maximize safety and independence while minimizing falls risk and caregiver burden. Upon hospital discharge, recommend STR to maximize pt safety and return to PLOF.            Recommendations for follow up therapy are one component of a multi-disciplinary  discharge planning process, led by the attending physician.  Recommendations may be updated based on patient status, additional functional criteria and insurance authorization.   Follow Up Recommendations  Skilled nursing-short term rehab (<3 hours/day)    Assistance Recommended at Discharge Frequent or constant Supervision/Assistance  Patient can return home with the following A little help with walking and/or transfers;A lot of help with bathing/dressing/bathroom;Assistance with cooking/housework;Help with stairs or ramp for entrance;Assist for transportation;Direct supervision/assist for medications management;Direct supervision/assist for financial management    Functional Status Assessment  Patient has had a recent decline in their functional status and demonstrates the ability to make significant improvements in function in a reasonable and predictable amount of time.  Equipment Recommendations  Other (comment) (Rolling walker)    Recommendations for Other Services       Precautions / Restrictions Precautions Precautions: Fall Restrictions Weight Bearing Restrictions: No      Mobility Bed Mobility Overal bed mobility: Needs Assistance Bed Mobility: Supine to Sit     Supine to sit: Supervision, HOB elevated     General bed mobility comments: significantly extra time to scoot to sitting EOB with use of bed rails, HOB initially elevated then flattened    Transfers   Equipment used: Rolling walker (2 wheels) Transfers: Sit to/from Stand Sit to Stand: +2 safety/equipment, Min assist                  Balance Overall balance assessment: Needs assistance Sitting-balance support: Feet supported, Bilateral upper extremity supported Sitting balance-Leahy Scale: Fair     Standing balance support: During functional activity, Single extremity supported, Reliant  on assistive device for balance Standing balance-Leahy Scale: Poor                              ADL either performed or assessed with clinical judgement   ADL Overall ADL's : Needs assistance/impaired                                       General ADL Comments: Pt is functionally limited by genearlized weakness, decreased activity tolerance, decreased safety awareness, and poor insight into deficits. She requires close SBA+2 for management of lines and leads during session and support for safe use of RW. Pt endorses she "never used a walker" but daughter states she has been working with HHPT on safe use of a rollator. Pt declines any functional activity during session. Is observed to perform feeding with set-up assist and min cueing to locate silverware and meal tray items. Anticipate Min-MOD A for more exertional ADL management including LB dressing and bathing.     Vision Baseline Vision/History: 1 Wears glasses Ability to See in Adequate Light: 1 Impaired Patient Visual Report: No change from baseline       Perception     Praxis      Pertinent Vitals/Pain Pain Assessment Pain Assessment: Faces Faces Pain Scale: Hurts a little bit Pain Location: c/o BLE stiffness/numbness (reports this is chronic since stroke in 2004) Pain Descriptors / Indicators: Grimacing, Guarding Pain Intervention(s): Monitored during session, Repositioned     Hand Dominance Right   Extremity/Trunk Assessment Upper Extremity Assessment Upper Extremity Assessment: Generalized weakness   Lower Extremity Assessment Lower Extremity Assessment: Generalized weakness   Cervical / Trunk Assessment Cervical / Trunk Assessment:  (slightly kyphotic)   Communication Communication Communication: No difficulties   Cognition Arousal/Alertness: Awake/alert Behavior During Therapy: Anxious (tearful at times.) Overall Cognitive Status: History of cognitive impairments - at baseline                                 General Comments: Pt oriented to year when given choice of 2,  recalls her birthday but unsure of how old she is. States she's in [place] vs [facility]. Daughter reports baseline cognition is poor.     General Comments       Exercises Other Exercises Other Exercises: Pt education limited 2/2 baseline cognitive status. Pt anxious t/o session and declines most offered functional activity. Becomes intermittently tearful, but unable to elaborate as to why. Pt endorses being very tired t/o session.   Shoulder Instructions      Home Living Family/patient expects to be discharged to:: Private residence Living Arrangements: Alone Available Help at Discharge: Family;Personal care attendant;Home health Type of Home: House Home Access: Stairs to enter [employer] of Steps: 2 Entrance Stairs-Rails: None (Unsure, pt states none.) Home Layout: One level     Bathroom Shower/Tub: Producer, television/film/video: Standard     Home Equipment: Rollator (4 wheels);Cane - single point   Additional Comments: Per pt, she lives alone in 1 level home with 2 steps to enter, unsure of rails. Spoke with daughter who reports she or another friend assists pt (pt only ever home alone at most x 1 hour at a time), pt was receiving HHPT to focus on gait with rollator for increased safety  but pt prefers SPC. Daughter reports pt needs a HH aide & they're looking for one, then states they have one that comes in 2-3x/week & PCA assists with bathing & dressing, otherwise daughter assists with this. Daughter reports pt has had 1 fall in past 6 months when she was weak getting out of the shower.      Prior Functioning/Environment               Mobility Comments: Ambulatory with SPC per pt, pt unreliable historian. ADLs Comments: Pt states she is totally independent but per daughter requires assistance with at least IADL management including medication management, cooking, etc. Per daughter pt has near round the clock supervision.        OT Problem List: Decreased  strength;Decreased coordination;Decreased activity tolerance;Decreased safety awareness;Decreased cognition;Impaired balance (sitting and/or standing);Decreased knowledge of use of DME or AE      OT Treatment/Interventions: Self-care/ADL training;Therapeutic exercise;Therapeutic activities;DME and/or AE instruction;Patient/family education;Balance training;Energy conservation    OT Goals(Current goals can be found in the care plan section) Acute Rehab OT Goals Patient Stated Goal: To go home OT Goal Formulation: With patient Time For Goal Achievement: 10/09/21 Potential to Achieve Goals: Fair ADL Goals Pt Will Perform Grooming: standing;with set-up;with supervision Pt Will Transfer to Toilet: bedside commode;with set-up;with supervision Pt Will Perform Toileting - Clothing Manipulation and hygiene: with supervision;with set-up;sit to/from stand;with adaptive equipment (c LRAD PRN)  OT Frequency: Min 2X/week    Co-evaluation PT/OT/SLP Co-Evaluation/Treatment: Yes Reason for Co-Treatment: Necessary to address cognition/behavior during functional activity;For patient/therapist safety;To address functional/ADL transfers PT goals addressed during session: Mobility/safety with mobility OT goals addressed during session: ADL's and self-care;Proper use of Adaptive equipment and DME      AM-PAC OT "6 Clicks" Daily Activity     Outcome Measure Help from another person eating meals?: A Little Help from another person taking care of personal grooming?: A Little Help from another person toileting, which includes using toliet, bedpan, or urinal?: A Little Help from another person bathing (including washing, rinsing, drying)?: A Lot Help from another person to put on and taking off regular upper body clothing?: A Little Help from another person to put on and taking off regular lower body clothing?: A Lot 6 Click Score: 16   End of Session Equipment Utilized During Treatment: Gait belt;Rolling  walker (2 wheels) Nurse Communication: Mobility status  Activity Tolerance: Other (comment) (limited by cognition vs. self-limiting) Patient left: in chair;with call bell/phone within reach;with chair alarm set  OT Visit Diagnosis: Other abnormalities of gait and mobility (R26.89);Muscle weakness (generalized) (M62.81)                Time: 1308-6578 OT Time Calculation (min): 19 min Charges:  OT General Charges $OT Visit: 1 Visit OT Evaluation $OT Eval Moderate Complexity: 1 Mod  Rockney Ghee, M.S., OTR/L Feeding Team - [facility] Ascom: [phone] 09/25/21, 3:27 PM

## 2021-09-25 NOTE — Progress Notes (Signed)
Pt complaining of R leg numbness (below the knee). Pulses on R ft palpable and warm. Assisted to walk at the hallway. Oncall provider informed.  ?

## 2021-09-25 NOTE — Evaluation (Signed)
Physical Therapy Evaluation Patient Details Name: Yolanda [name] MRN: [id number] DOB: [birth year] Today's Date: 09/25/2021  History of Present Illness  Pt is a 66 y/o F admitted on 09/24/21 after presenting with c/o syncope & fatigue. Pt noted to have Hgb of 7.3 down from 14.1 in Feb 2023. Pt with hx of cognitive decline/dementia. Pt is being treated for acute symptomatic anemia. PMH: stroke with L sided hemiplegia, VP shunt, HTN, COPD, CKD stage 3A  Clinical Impression  Pt seen for PT evaluation with co-tx with OT. Pt reports PLOF that is not correct per daughter. Pt was living at home with assistance from family, friends, and aide prior to admission. Pt was ambulatory with [facility] but working with HHPT to focus on gait with rollator for increased safety with mobility. On this date, pt requires use of hospital bed features & significantly extra time to complete supine>sit. Attempted to simulate sit>stand with SPC with pt holding to PT's hand for unilateral support pt but unable to stand without BUE support so provided with RW. Pt ambulates to sink & back with RW & CGA<>min assist with pt requesting to "catch my breath" after short distance gait, citing she's been in bed since she's been here. Pt also reports she sleeps during the day & is awake at night & has been like that since her stroke in 2004 but unsure if this is accurate or not. Spoke with pt's daughters who report pt was receiving assistance at home prior to admission but caring for pt is becoming difficult. At this time, pt would benefit from STR upon d/c to maximize independence with functional mobility, reduce fall risk, & decrease caregiver burden prior to return home.     Recommendations for follow up therapy are one component of a multi-disciplinary discharge planning process, led by the attending physician.  Recommendations may be updated based on patient status, additional functional criteria and insurance authorization.  Follow Up  Recommendations Skilled nursing-short term rehab (<3 hours/day)    Assistance Recommended at Discharge Frequent or constant Supervision/Assistance  Patient can return home with the following  A little help with walking and/or transfers;A lot of help with bathing/dressing/bathroom;Assistance with cooking/housework;Direct supervision/assist for financial management;Assist for transportation;Direct supervision/assist for medications management;Help with stairs or ramp for entrance    Equipment Recommendations None recommended by PT  Recommendations for Other Services       Functional Status Assessment Patient has had a recent decline in their functional status and demonstrates the ability to make significant improvements in function in a reasonable and predictable amount of time.     Precautions / Restrictions Precautions Precautions: Fall Restrictions Weight Bearing Restrictions: No      Mobility  Bed Mobility Overal bed mobility: Needs Assistance Bed Mobility: Supine to Sit     Supine to sit: Supervision, HOB elevated     General bed mobility comments: significantly extra time to scoot to sitting EOB with use of bed rails, HOB initially elevated then flattened    Transfers Overall transfer level: Needs assistance Equipment used: 1 person hand held assist Transfers: Sit to/from Stand Sit to Stand: Min assist                Ambulation/Gait Ambulation/Gait assistance: Min assist Gait Distance (Feet): 20 Feet Assistive device: Rolling walker (2 wheels) Gait Pattern/deviations: Decreased step length - right, Decreased stride length, Decreased step length - left, Trunk flexed Gait velocity: decreased        Stairs  Wheelchair Mobility    Modified Rankin (Stroke Patients Only)       Balance Overall balance assessment: Needs assistance Sitting-balance support: Feet supported, Bilateral upper extremity supported Sitting balance-Leahy Scale:  Fair     Standing balance support: During functional activity, Single extremity supported Standing balance-Leahy Scale: Poor                               Pertinent Vitals/Pain Pain Assessment Pain Assessment: Faces Faces Pain Scale: Hurts a little bit Pain Location: c/o BLE stiffness/numbness (reports this is chronic since stroke in 2004) Pain Descriptors / Indicators:  (stiff/numb) Pain Intervention(s): Monitored during session    Home Living Family/patient expects to be discharged to:: Private residence Living Arrangements: Alone Available Help at Discharge: Family;Personal care attendant;Home health Type of Home: House Home Access: Stairs to enter Entrance Stairs-Rails:  (unsure) Entrance Stairs-Number of Steps: 2   Home Layout: One level Home Equipment: Rollator (4 wheels);Cane - single point Additional Comments: Per pt, she lives alone in 1 level home with 2 steps to enter, unsure of rails. Spoke with daughter who reports she or another friend assists pt (pt only ever home alone at most x 1 hour at a time), pt was receiving HHPT to focus on gait with rollator for increased safety but pt prefers SPC. Daughter reports pt needs a HH aide & they're looking for one, then states they have one that comes in 2-3x/week & PCA assists with bathing & dressing, otherwise daughter assists with this. Daughter reports pt has had 1 fall in past 6 months when she was weak getting out of the shower.    Prior Function               Mobility Comments: Ambulatory with SPC       Hand Dominance   Dominant Hand: Right    Extremity/Trunk Assessment   Upper Extremity Assessment Upper Extremity Assessment: Generalized weakness    Lower Extremity Assessment Lower Extremity Assessment: Generalized weakness    Cervical / Trunk Assessment Cervical / Trunk Assessment:  (slightly kyphotic)  Communication   Communication: No difficulties  Cognition Arousal/Alertness:  Awake/alert Behavior During Therapy: Anxious (pt becomes almost tearful at times) Overall Cognitive Status: History of cognitive impairments - at baseline                                 General Comments: Pt oriented to year when given choice of 2, recalls her birthday but unsure of how old she is. States she's in [place] vs [place]. Daughter reports baseline cognition is poor.        General Comments      Exercises     Assessment/Plan    PT Assessment Patient needs continued PT services  PT Problem List Decreased strength;Decreased mobility;Decreased safety awareness;Decreased activity tolerance;Decreased cognition;Cardiopulmonary status limiting activity;Decreased knowledge of use of DME;Decreased balance       PT Treatment Interventions DME instruction;Therapeutic exercise;Gait training;Balance training;Stair training;Neuromuscular re-education;Functional mobility training;Patient/family education;Manual techniques;Modalities    PT Goals (Current goals can be found in the Care Plan section)  Acute Rehab PT Goals Patient Stated Goal: Go home & walk with cane PT Goal Formulation: With patient Time For Goal Achievement: 10/09/21 Potential to Achieve Goals: Fair    Frequency Min 2X/week     Co-evaluation PT/OT/SLP Co-Evaluation/Treatment: Yes Reason for Co-Treatment: For patient/therapist safety;Necessary to  address cognition/behavior during functional activity PT goals addressed during session: Mobility/safety with mobility;Balance         AM-PAC PT "6 Clicks" Mobility  Outcome Measure Help needed turning from your back to your side while in a flat bed without using bedrails?: A Little Help needed moving from lying on your back to sitting on the side of a flat bed without using bedrails?: A Little Help needed moving to and from a bed to a chair (including a wheelchair)?: A Little Help needed standing up from a chair using your arms (e.g., wheelchair or  bedside chair)?: A Little Help needed to walk in hospital room?: A Little Help needed climbing 3-5 steps with a railing? : A Lot 6 Click Score: 17    End of Session   Activity Tolerance: Patient limited by fatigue Patient left: in chair;with chair alarm set;with call bell/phone within reach Nurse Communication: Mobility status PT Visit Diagnosis: Muscle weakness (generalized) (M62.81);Difficulty in walking, not elsewhere classified (R26.2);Unsteadiness on feet (R26.81)    Time: 1259-1316 PT Time Calculation (min) (ACUTE ONLY): 17 min   Charges:   PT Evaluation $PT Eval Moderate Complexity: [place], PT, DPT 09/25/21, 1:46 PM   Waunita Schooner 09/25/2021, 1:44 PM

## 2021-09-25 NOTE — TOC Progression Note (Signed)
Transition of Care (TOC) - Progression Note  ? ? ?Patient Details  ?Name: Yolanda [name] ?MRN: [id number] ?Date of Birth: 12/29/1955 ? ?Transition of Care (TOC) CM/SW Contact  ?Bing Quarry, RN ?Phone Number: ?09/25/2021, 11:54 AM ? ?Clinical Narrative:  3/11:  Admitted from home via EMS to [facility] ED as OBS 3/11: Admitted from home as OBS with  ?7 pt. Drop in [facility].  ?Syncopal episode when family tried to take to PCP.  ?Not eating well per ED triage notes.  ?Recent treatment for UTI.  ?Htx of right-sided ventriculoperitoneal shunt per care everywhere via [facility] 08/28/21. ?Followed by [facility].  ?EDD Monday per provider in rounds today and should be moved to inpatient per provider note of 3/11. ?_______________________________________________ ?PCP: Garry Heater ?[id number] - [place], [place] - [address] [address] ?POA on file appointing Temple Poteat & Reola Calkins scanned documents. ?Denver Faster (Daughter)  ?[phone] (Mobile) ?Garret Reddish (daughter) [phone] ?PCP: Garry Heater ?[id number] - [place], [place] - [address] ?POA on file appointing Temple Poteat & Reola Calkins scanned documents. ?Denver Faster (Daughter)  ?[phone] (Mobile) ?Garret Reddish (daughter) [phone] ?Gabriel Cirri RN CM  ? ? ? ?  ?  ? ?Expected Discharge Plan and Services ?  ?  ?  ?  ?  ?                ?  ?  ?  ?  ?  ?  ?  ?  ?  ?  ? ? ?Social Determinants of Health (SDOH) Interventions ?  ? ?Readmission Risk Interventions ?No flowsheet data found. ? ?

## 2021-09-26 ENCOUNTER — Inpatient Hospital Stay: Payer: Medicare Other

## 2021-09-26 ENCOUNTER — Encounter: Payer: Self-pay | Admitting: Internal Medicine

## 2021-09-26 DIAGNOSIS — D509 Iron deficiency anemia, unspecified: Secondary | ICD-10-CM

## 2021-09-26 DIAGNOSIS — D519 Vitamin B12 deficiency anemia, unspecified: Secondary | ICD-10-CM | POA: Diagnosis not present

## 2021-09-26 DIAGNOSIS — D649 Anemia, unspecified: Secondary | ICD-10-CM | POA: Diagnosis not present

## 2021-09-26 LAB — COMPREHENSIVE METABOLIC PANEL
ALT: 7 U/L (ref 0–44)
AST: 11 U/L — ABNORMAL LOW (ref 15–41)
Albumin: 2.9 g/dL — ABNORMAL LOW (ref 3.5–5.0)
Alkaline Phosphatase: 47 U/L (ref 38–126)
Anion gap: 7 (ref 5–15)
BUN: 21 mg/dL (ref 8–23)
CO2: 22 mmol/L (ref 22–32)
Calcium: 8.2 mg/dL — ABNORMAL LOW (ref 8.9–10.3)
Chloride: 105 mmol/L (ref 98–111)
Creatinine, Ser: 2.04 mg/dL — ABNORMAL HIGH (ref 0.44–1.00)
GFR, Estimated: 26 mL/min — ABNORMAL LOW (ref >60)
Glucose, Bld: 92 mg/dL (ref 70–99)
Potassium: 4 mmol/L (ref 3.5–5.1)
Sodium: 134 mmol/L — ABNORMAL LOW (ref 135–145)
Total Bilirubin: 1.4 mg/dL — ABNORMAL HIGH (ref 0.3–1.2)
Total Protein: 6.2 g/dL — ABNORMAL LOW (ref 6.5–8.1)

## 2021-09-26 LAB — HAPTOGLOBIN: Haptoglobin: 207 mg/dL (ref 37–355)

## 2021-09-26 LAB — HEMOGLOBIN
Hemoglobin: 7.7 g/dL — ABNORMAL LOW (ref 12.0–15.0)
Hemoglobin: 8.2 g/dL — ABNORMAL LOW (ref 12.0–15.0)

## 2021-09-26 LAB — MAGNESIUM: Magnesium: 1.9 mg/dL (ref 1.7–2.4)

## 2021-09-26 MED ORDER — POLYETHYLENE GLYCOL 3350 17 GM/SCOOP PO POWD: 1.0000 | Freq: Once | ORAL | Status: DC

## 2021-09-26 MED ORDER — POLYETHYLENE GLYCOL 3350 17 GM/SCOOP PO POWD
1.0000 | Freq: Once | ORAL | Status: AC
  Administered 2021-09-26: 255 g via ORAL
  Filled 2021-09-26: qty 255

## 2021-09-26 MED ORDER — VITAMIN B-12 1000 MCG PO TABS
1000.0000 ug | ORAL_TABLET | Freq: Every day | ORAL | Status: DC
  Administered 2021-09-27 – 2021-10-01 (×5): 1000 ug via ORAL
  Filled 2021-09-26 (×5): qty 1

## 2021-09-26 MED ORDER — CYANOCOBALAMIN 1000 MCG/ML IJ SOLN
1000.0000 ug | Freq: Once | INTRAMUSCULAR | Status: AC
  Administered 2021-09-26: 1000 ug via INTRAMUSCULAR
  Filled 2021-09-26: qty 1

## 2021-09-26 MED ORDER — SODIUM CHLORIDE 0.9 % IV SOLN: INTRAVENOUS | Status: DC

## 2021-09-26 MED ORDER — SODIUM CHLORIDE 0.9 % IV SOLN
200.0000 mg | Freq: Once | INTRAVENOUS | Status: AC
  Administered 2021-09-26: 200 mg via INTRAVENOUS
  Filled 2021-09-26: qty 200

## 2021-09-26 NOTE — Consult Note (Signed)
Cephas Darby, MD [address]  [place]  [place], [place] [address]  Main: [phone]  Fax: [phone] Pager: [phone]   Consultation  Referring Provider:     No ref. provider found Primary Care Physician:  Lorna Few, DO Primary Gastroenterologist: unassigned         Reason for Consultation:   Anemia  Date of Admission:  09/24/2021 Date of Consultation:  09/26/2021         HPI:   Yolanda [name] is a 66 y.o. female with history of cognitive decline secondary to multiple strokes and aneurysm, history of VP shunt.  Patient presents after syncopal event preceded by progressive weakness.  Patient has history of stage III CKD, her hemoglobin 7.7 on presentation, down from 14.1 on 08/31/2021 no reported evidence of black or bloody stools, but patient is a very poor historian due to dementia, lives alone and daughters are not aware of melena or hematochezia.  Therefore, GI is consulted for further evaluation.  Labs revealed severe B12 deficiency, levels were 115, mild iron deficiency, ferritin 31, apparently patient has chronic B12 deficiency anemia.  When I interviewed the patient in the room, she denied any abdominal pain, nausea or vomiting.  Apparently, she reported to me that her stool was brown but she also told me that she never checks her stool.  Patient is currently receiving IV iron.  Patient keeps insisting that she wants to go home and she does not want to undergo any testing.   NSAIDs: none  Antiplts/Anticoagulants/Anti thrombotics: none  GI Procedures: colonoscopy in 07/2004 was normal  Past Medical History:  Diagnosis Date   ALLERGIC RHINITIS 10/25/2007   ANXIETY 10/25/2007   B12 DEFICIENCY 12/01/2009   BACK PAIN 10/25/2007   BRADYCARDIA 11/19/2009   CEREBROVASCULAR ACCIDENT, HX OF 10/25/2007   CHEST PAIN 12/31/2007   COPD 10/25/2007   DEPRESSION 10/25/2007   FATIGUE 10/25/2007   GERD 10/25/2007   Headache(784.0) 10/25/2007   HEMIPARESIS, LEFT 11/23/2009    HYPERLIPIDEMIA 10/25/2007   HYPERTENSION 10/25/2007   INSOMNIA-SLEEP DISORDER-UNSPEC 10/25/2007   MITRAL INSUFFICIENCY 10/25/2007   OSTEOPENIA 11/19/2009   OSTEOPOROSIS 10/25/2007   PERIPHERAL VASCULAR DISEASE 10/25/2007   Unspecified visual loss 11/23/2009   VITAMIN D DEFICIENCY 06/03/2010    Past Surgical History:  Procedure Laterality Date   CHOLECYSTECTOMY     TUBAL LIGATION      Prior to Admission medications   Medication Sig Start Date End Date Taking? Authorizing Provider  alendronate (FOSAMAX) 70 MG tablet TAKE 1 TABLET BY MOUTH ONCE A WEEK WITH GLASS OF WATER ON AN EMPTY STOMACH Patient taking differently: Take 70 mg by mouth every Tuesday. 05/25/15  Yes Biagio Borg, MD  amLODipine (NORVASC) 10 MG tablet Take 1 tablet (10 mg total) by mouth daily. Patient taking differently: Take 10 mg by mouth at bedtime. 12/03/14  Yes Biagio Borg, MD  aspirin EC 81 MG tablet Take 81 mg by mouth in the morning.   Yes [provider]  Calcium Carbonate-Vit D-Min (CALCIUM 600+D3 PLUS MINERALS) 600-800 MG-UNIT TABS Take 1 tablet by mouth daily.   Yes [provider]  hydrALAZINE (APRESOLINE) 100 MG tablet Take 100 mg by mouth 2 (two) times daily.   Yes [provider]  lovastatin (MEVACOR) 20 MG tablet 3 tabs by mouth per day Patient taking differently: Take 20 mg by mouth daily with supper. 12/03/14  Yes Biagio Borg, MD  melatonin 3 MG TABS tablet Take  3 mg by mouth at bedtime.   Yes [provider]  mirtazapine (REMERON) 7.5 MG tablet Take 7.5 mg by mouth at bedtime.   Yes [provider]  potassium chloride SA (KLOR-CON) 20 MEQ tablet Take 20 mEq by mouth in the morning.   Yes [provider]   Current Facility-Administered Medications:    0.9 %  sodium chloride infusion, , Intravenous, Continuous, Ulmer Degen, Tally Due, MD, Last Rate: 20 mL/hr at 09/26/21 1035, New Bag at 09/26/21 1035   acetaminophen (TYLENOL) tablet 650 mg, 650 mg, Oral, Q6H PRN  **OR** acetaminophen (TYLENOL) suppository 650 mg, 650 mg, Rectal, Q6H PRN, Kristopher Oppenheim, DO   amLODipine (NORVASC) tablet 10 mg, 10 mg, Oral, Daily, Kristopher Oppenheim, DO, 10 mg at 09/26/21 7322   lactated ringers infusion, , Intravenous, Continuous, Priscella Mann, Sudheer B, MD, Last Rate: 100 mL/hr at 09/26/21 0759, Infusion Verify at 09/26/21 0759   melatonin tablet 10 mg, 10 mg, Oral, QHS PRN, Kristopher Oppenheim, DO, 10 mg at 09/26/21 0113   ondansetron (ZOFRAN) tablet 4 mg, 4 mg, Oral, Q6H PRN **OR** ondansetron (ZOFRAN) injection 4 mg, 4 mg, Intravenous, Q6H PRN, Kristopher Oppenheim, DO   pantoprazole (PROTONIX) EC tablet 40 mg, 40 mg, Oral, BID, Kristopher Oppenheim, DO, 40 mg at 09/26/21 0857   polyethylene glycol powder (GLYCOLAX/MIRALAX) container 255 g, 1 Container, Oral, Once, Dorothe Pea, RPH   [START ON 09/27/2021] vitamin B-12 (CYANOCOBALAMIN) tablet 1,000 mcg, 1,000 mcg, Oral, Daily, Sreenath, Sudheer B, MD   Family History  Problem Relation Age of Onset   Alcohol abuse Other        ETOH Dependence   Arthritis Other    Heart disease Other    Hypertension Other    Stroke Other    Depression Other      Social History   Tobacco Use   Smoking status: Former   Smokeless tobacco: Never  Substance Use Topics   Alcohol use: Yes   Drug use: No    Allergies as of 09/24/2021 - Review Complete 09/24/2021  Allergen Reaction Noted   Latex Swelling 04/08/2014   Ace inhibitors  10/25/2007    Review of Systems:    All systems reviewed and negative except where noted in HPI.   Physical Exam:  Vital signs in last 24 hours: Temp:  [98.4 F (36.9 C)-99.2 F (37.3 C)] 98.4 F (36.9 C) (03/12 1214) Pulse Rate:  [76-85] 85 (03/12 1214) Resp:  [18-20] 18 (03/12 1214) BP: (113-138)/(38-88) 133/38 (03/12 1214) SpO2:  [100 %] 100 % (03/12 0823)   General:   Pleasant, cooperative in NAD Head:  Normocephalic and atraumatic. Eyes:   No icterus.   Conjunctiva pale, PERRLA. Ears:  Normal auditory acuity. Neck:   Supple; no masses or thyroidomegaly Lungs: Respirations even and unlabored. Lungs clear to auscultation bilaterally.   No wheezes, crackles, or rhonchi.  Heart:  Regular rate and rhythm;  Without murmur, clicks, rubs or gallops Abdomen:  Soft, nondistended, nontender. Normal bowel sounds. No appreciable masses or hepatomegaly.  No rebound or guarding.  Rectal:  Not performed. Msk:  Symmetrical without gross deformities.  Strength generalized weakness Extremities:  Without edema, cyanosis or clubbing. Neurologic:  Alert and oriented x2;  grossly normal neurologically. Skin:  Intact without significant lesions or rashes. Psych:  Alert and cooperative. Normal affect.  LAB RESULTS: CBC Latest Ref Rng & Units 09/26/2021 09/25/2021 09/25/2021  WBC 4.0 - 10.5 K/uL - - -  Hemoglobin 12.0 - 15.0 g/dL 7.7(L) 8.4(L)  8.8(L)  Hematocrit 36.0 - 46.0 % - - -  Platelets 150 - 400 K/uL - - -    BMET BMP Latest Ref Rng & Units 09/26/2021 09/25/2021 09/24/2021  Glucose 70 - 99 mg/dL 92 100(H) 97  BUN 8 - 23 mg/dL 21 25(H) 24(H)  Creatinine 0.44 - 1.00 mg/dL 2.04(H) 2.04(H) 2.30(H)  Sodium 135 - 145 mmol/L 134(L) 132(L) 135  Potassium 3.5 - 5.1 mmol/L 4.0 4.2 4.5  Chloride 98 - 111 mmol/L 105 107 107  CO2 22 - 32 mmol/L 22 19(L) 19(L)  Calcium 8.9 - 10.3 mg/dL 8.2(L) 8.0(L) 8.8(L)    LFT Hepatic Function Latest Ref Rng & Units 09/26/2021 09/25/2021 01/23/2015  Total Protein 6.5 - 8.1 g/dL 6.2(L) 5.8(L) 7.3  Albumin 3.5 - 5.0 g/dL 2.9(L) 2.7(L) 4.2  AST 15 - 41 U/L 11(L) 7(L) 14  ALT 0 - 44 U/L _0 Alk Phosphatase 38 - 126 U/L 47 44 85  Total Bilirubin 0.3 - 1.2 mg/dL 1.4(H) 1.7(H) 0.9  Bilirubin, Direct 0.0 - 0.3 mg/dL - - 0.1     STUDIES: CT ABDOMEN PELVIS WO CONTRAST  Result Date: 09/25/2021 CLINICAL DATA:  Acute abdominal pain.  Weakness. EXAM: CT ABDOMEN AND PELVIS WITHOUT CONTRAST TECHNIQUE: Multidetector CT imaging of the abdomen and pelvis was performed following the standard protocol  without IV contrast. RADIATION DOSE REDUCTION: This exam was performed according to the departmental dose-optimization program which includes automated exposure control, adjustment of the mA and/or kV according to patient size and/or use of iterative reconstruction technique. COMPARISON:  Renal ultrasound 02/18/2020 and report from CT abdomen from 01/31/2002 FINDINGS: Lower chest: Mild dependent atelectasis in both lower lobes. Descending thoracic aortic and coronary artery atherosclerotic calcification. Mild cardiomegaly. Hepatobiliary: Cholecystectomy. Distal CBD proximally 0.7 cm in diameter on image 28 series 2. Pancreas: Unremarkable Spleen: Unremarkable Adrenals/Urinary Tract: Both adrenal glands appear normal. 2.8 cm left kidney upper pole fluid density lesion compatible with cyst as shown on prior ultrasound. 0.8 cm hypodense lesion in the right kidney upper pole as shown on prior ultrasound. No urinary tract calculi observed. Urinary bladder unremarkable. Stomach/Bowel: Fatty deposition in the wall of the ascending colon, this is frequently incidental although does have a weak association with inflammatory bowel disease. Terminal ileum currently appears unremarkable. Appendix unremarkable. Focal hazy opacity in a 1.9 by 1.2 cm lobulation of adipose tissue anterior to the descending colon on image 53 series 12, possibly from appendagitis epiploica or focal fat necrosis. Vascular/Lymphatic: Atherosclerosis is present, including aortoiliac atherosclerotic disease. No pathologic adenopathy identified. Reproductive: Vascular calcifications along the margins of the uterus. Other: Peritoneal shunt tubing terminates in the left lower quadrant. Musculoskeletal: Mild prominence of epidural adipose tissues in the lower lumbar spine. Mild lower lumbar spondylosis and degenerative disc disease. IMPRESSION: 1. Focal nodular stranding in adipose tissue anterior to the distal descending colon on image 53 of series 2  potentially from appendagitis epiploica or focal fat necrosis. 2. Other imaging findings of potential clinical significance: Mild dependent atelectasis in both lower lobes. Aortic Atherosclerosis (ICD10-I70.0). Coronary atherosclerosis. Mild cardiomegaly. Bilateral renal cysts. Mild lower lumbar spondylosis and degenerative disc disease. Electronically Signed   By: Van Clines M.D.   On: 09/25/2021 11:08   CT HEAD WO CONTRAST (5MM)  Result Date: 09/24/2021 CLINICAL DATA:  Syncope. EXAM: CT HEAD WITHOUT CONTRAST TECHNIQUE: Contiguous axial images were obtained from the base of the skull through the vertex without intravenous contrast. RADIATION DOSE REDUCTION: This exam was performed according to  the departmental dose-optimization program which includes automated exposure control, adjustment of the mA and/or kV according to patient size and/or use of iterative reconstruction technique. COMPARISON:  September 20, 2014. FINDINGS: Brain: Right frontal ventriculostomy is noted with distal tip passing through left frontal horn and tip in left basal ganglia. No ventricular dilatation is noted. Aneurysm clip is noted in left basal ganglia region. No mass effect or midline shift is noted. No acute infarction, hemorrhage or mass lesion is noted. Vascular: No hyperdense vessel or unexpected calcification. Skull: Status post right temporal craniotomy. Right frontal ventriculostomy is noted. Sinuses/Orbits: No acute finding. Other: None. IMPRESSION: Right frontal ventriculostomy catheter as described above. No ventricular dilatation is noted. No acute intracranial abnormality seen. Electronically Signed   By: Marijo Conception M.D.   On: 09/24/2021 16:17   US Venous Img Lower Unilateral Right (DVT)  Result Date: 09/26/2021 CLINICAL DATA:  Right lower extremity pain. Evaluate for deep venous thrombosis. EXAM: RIGHT LOWER EXTREMITY VENOUS DOPPLER ULTRASOUND TECHNIQUE: Gray-scale sonography with graded compression, as well  as color Doppler and duplex ultrasound were performed to evaluate the lower extremity deep venous systems from the level of the common femoral vein and including the common femoral, femoral, profunda femoral, popliteal and calf veins including the posterior tibial, peroneal and gastrocnemius veins when visible. The superficial great saphenous vein was also interrogated. Spectral Doppler was utilized to evaluate flow at rest and with distal augmentation maneuvers in the common femoral, femoral and popliteal veins. COMPARISON:  None. FINDINGS: Contralateral Common Femoral Vein: Respiratory phasicity is normal and symmetric with the symptomatic side. No evidence of thrombus. Normal compressibility. Common Femoral Vein: No evidence of thrombus. Normal compressibility, respiratory phasicity and response to augmentation. Saphenofemoral Junction: No evidence of thrombus. Normal compressibility and flow on color Doppler imaging. Profunda Femoral Vein: No evidence of thrombus. Normal compressibility and flow on color Doppler imaging. Femoral Vein: No evidence of thrombus. Normal compressibility, respiratory phasicity and response to augmentation. Popliteal Vein: No evidence of thrombus. Normal compressibility, respiratory phasicity and response to augmentation. Calf Veins: No evidence of thrombus. Normal compressibility and flow on color Doppler imaging. Other Findings:  None. IMPRESSION: Negative for deep venous thrombosis in right lower extremity. Electronically Signed   By: Markus Daft M.D.   On: 09/26/2021 13:31      Impression / Plan:   Yolanda [name] is a 66 y.o. female with history of multiple strokes, history of aneurysm status post with patient is admitted with progressive weakness, syncope in the setting of severe symptomatic anemia.  CT abdomen pelvis without contrast did not reveal any significant pathology  Severe symptomatic anemia, history of iron and B12 deficiency No witnessed episodes of active GI  bleed Agree with Protonix 40 mg p.o. twice daily Monitor CBC closely to maintain hemoglobin above 8 Agree with parenteral iron and B12 replacement Recommend upper endoscopy and colonoscopy with possible video capsule endoscopy, tentatively scheduled for tomorrow Clear liquid diet today Bowel prep ordered N.p.o. effective 5 AM tomorrow I had a phone conversation with both her daughters on a conference call and both agreed to proceed with endoscopy evaluation.  They want all the information and recommendations to be relayed to them directly and not to discuss with the patient because she has dementia and does not have a capacity to make any decision  I have discussed alternative options, risks & benefits,  which include, but are not limited to, bleeding, infection, perforation,respiratory complication & drug reaction.  The patient agrees  with this plan & written consent will be obtained.     Thank you for involving me in the care of this patient.  Dr. Allen Norris will cover from tomorrow    LOS: 1 day   Sherri Sear, MD  09/26/2021, 2:35 PM    Note: This dictation was prepared with Dragon dictation along with smaller phrase technology. Any transcriptional errors that result from this process are unintentional.

## 2021-09-26 NOTE — Progress Notes (Addendum)
PROGRESS NOTE    Yolanda [name]  [id number] DOB: 03/06/1956 DOA: 09/24/2021 PCP: Lorna Few, DO    Brief Narrative:  66 year old African-American female with a history of stroke with left-sided hemiplegia, history of VP shunt, hypertension, COPD, CKD stage IIIa, who lives at home alone.  Patient reportedly has had fatigue for unknown duration of time.  Patient has a very poor memory.  Based upon labs, she had a CBC performed last month in February 2023 with a hemoglobin of 14.1 g/dL.  Today was checked in the ER and was 7.3.  Patient denies having any rectal bleeding, black stools, melena.  Patient is a poor historian due to her history of prior strokes and aneurysms.  She does have history of cognitive decline/dementia.  History was obtained by speaking with the patient's daughter at bedside.  They are unaware of any rectal bleeding or black stools however the patient lives alone and the exact nature of her bowel movements is unclear.  Anemia Labs reviewed.  Patient with B12 and mild iron deficiency.  Will dose IM B12 and IV Venofer.  GI consulted with plans for endoscopic evaluation   Assessment & Plan:   Principal Problem:   Symptomatic anemia Active Problems:   AKI (acute kidney injury) ([place])   Hemiplegia as late effect of cerebrovascular accident (CVA) ([place]) - left side hemiplegia   Essential hypertension   COPD (chronic obstructive pulmonary disease) (H[facility])   Stage 3a chronic kidney disease (CKD) ([facility]) - baseline SCr 1.6   Dementia (H[facility])  Acute symptomatic anemia Syncope B12 deficiency Mild iron deficiency anemia Patient has had seven-point drop in hemoglobin over the past month Unclear nature of bowel movements that time No known history of bleeding disorder Only antiplatelet agent is aspirin, no blood thinners B12 and iron studies mildly reduced No clinical evidence of hemolysis No etiology for blood loss on CT abdomen Plan: Continue p.o. Protonix IM B12 IV  Venofer Twice daily hemoglobin checks Fall precautions and therapy evaluations GI consulted, likely endoscopy evaluation on 3/13.  N.p.o. after midnight.  Clear liquid diet today  AKI on CKD stage IIIa Baseline creatinine 1.6 Creatinine now remaining at 2.04 despite fluids Plan: Continue LR 100 cc/h for today Trend renal function Consider nephrology input  Dementia/cognitive decline History of CVA History of VP shunt Appears stable for now Mental status at baseline Plan: Holding aspirin Continue home Aricept  COPD Appears stable, on room air No evidence of exacerbation Vitals per unit protocol  Essential hypertension Blood pressure currently controlled   DVT prophylaxis: SCDs Code Status: Full Family Communication: Daughter GiGi at bedside 3/11, daughter [name] via phone 3/11. Daughter [name] via phone 3/12 Disposition Plan: Status is: Inpatient Remains inpatient appropriate because: Acute symptomatic anemia.  Suspicion for GI bleed.  Plan for endoscopy on 3/13     Level of care: Med-Surg  Consultants:  None  Procedures:  None  Antimicrobials: None   Subjective: Seen and examined.  Resting comfortably in bed.  No visible distress.  No pain complaints.  Objective: Vitals:   09/25/21 1548 09/25/21 1937 09/26/21 0356 09/26/21 0823  BP: 113/74 127/85 138/82 133/88  Pulse: 77 76 76 85  Resp: 19 20 20 18   Temp: 98.4 F (36.9 C) 99.2 F (37.3 C) 99 F (37.2 C) 98.4 F (36.9 C)  TempSrc:  Oral Oral Oral  SpO2: 100% 100% 100% 100%    Intake/Output Summary (Last 24 hours) at 09/26/2021 1001 Last data filed at 09/26/2021 0759 Gross per  24 hour  Intake 1764.72 ml  Output --  Net 1764.72 ml   There were no vitals filed for this visit.  Examination:  General exam: NAD.  Answers all questions appropriately Respiratory system: Lungs clear.  Normal work of breathing.  Room air Cardiovascular system: S1-S2, RRR, no murmurs, no pedal  edema Gastrointestinal system: Soft, NT/ND, normal bowel sounds Central nervous system: Alert, oriented x2, no focal deficits Extremities: Symmetric 5 x 5 power. Skin: No rashes, lesions or ulcers Psychiatry: Judgement and insight appear impaired. Mood & affect flattened.     Data Reviewed: I have personally reviewed following labs and imaging studies  CBC: Recent Labs  Lab 09/24/21 1326 09/24/21 2331 09/25/21 0432 09/25/21 1241 09/25/21 1718 09/26/21 0504  WBC 9.0 6.8 5.7  --   --   --   NEUTROABS  --   --  3.8  --   --   --   HGB 7.3* 8.5* 7.7* 8.8* 8.4* 7.7*  HCT 24.0* 26.6* 24.4*  --   --   --   MCV 85.4 83.9 84.1  --   --   --   PLT 357 313 260  --   --   --    Basic Metabolic Panel: Recent Labs  Lab 09/24/21 1326 09/25/21 0432 09/26/21 0504  NA 135 132* 134*  K 4.5 4.2 4.0  CL 107 107 105  CO2 19* 19* 22  GLUCOSE 97 100* 92  BUN 24* 25* 21  CREATININE 2.30* 2.04* 2.04*  CALCIUM 8.8* 8.0* 8.2*  MG  --  2.1 1.9   GFR: CrCl cannot be calculated (Unknown ideal weight.). Liver Function Tests: Recent Labs  Lab 09/25/21 0432 09/26/21 0504  AST 7* 11*  ALT 7 7  ALKPHOS 44 47  BILITOT 1.7* 1.4*  PROT 5.8* 6.2*  ALBUMIN 2.7* 2.9*   No results for input(s): LIPASE, AMYLASE in the last 168 hours. No results for input(s): AMMONIA in the last 168 hours. Coagulation Profile: Recent Labs  Lab 09/24/21 1326 09/25/21 1041  INR 1.0 1.1   Cardiac Enzymes: No results for input(s): CKTOTAL, CKMB, CKMBINDEX, TROPONINI in the last 168 hours. BNP (last 3 results) No results for input(s): PROBNP in the last 8760 hours. HbA1C: No results for input(s): HGBA1C in the last 72 hours. CBG: No results for input(s): GLUCAP in the last 168 hours. Lipid Profile: No results for input(s): CHOL, HDL, LDLCALC, TRIG, CHOLHDL, LDLDIRECT in the last 72 hours. Thyroid Function Tests: No results for input(s): TSH, T4TOTAL, FREET4, T3FREE, THYROIDAB in the last 72 hours. Anemia  Panel: Recent Labs    09/24/21 1548 09/24/21 1602 09/25/21 0758  VITAMINB12  --   --  115*  FOLATE  --   --  8.2  FERRITIN  --   --  31  TIBC  --  329 314  IRON  --  34 67  RETICCTPCT 6.3*  --   --    Sepsis Labs: No results for input(s): PROCALCITON, LATICACIDVEN in the last 168 hours.  Recent Results (from the past 240 hour(s))  Resp Panel by RT-PCR (Flu A&B, Covid) Nasopharyngeal Swab     Status: None   Collection Time: 09/25/21 12:00 PM   Specimen: Nasopharyngeal Swab; Nasopharyngeal(NP) swabs in vial transport medium  Result Value Ref Range Status   SARS Coronavirus 2 by RT PCR NEGATIVE NEGATIVE Final    Comment: (NOTE) SARS-CoV-2 target nucleic acids are NOT DETECTED.  The SARS-CoV-2 RNA is generally detectable in upper respiratory  specimens during the acute phase of infection. The lowest concentration of SARS-CoV-2 viral copies this assay can detect is 138 copies/mL. A negative result does not preclude SARS-Cov-2 infection and should not be used as the sole basis for treatment or other patient management decisions. A negative result may occur with  improper specimen collection/handling, submission of specimen other than nasopharyngeal swab, presence of viral mutation(s) within the areas targeted by this assay, and inadequate number of viral copies(<138 copies/mL). A negative result must be combined with clinical observations, patient history, and epidemiological information. The expected result is Negative.  Fact Sheet for Patients:  EntrepreneurPulse.com.au  Fact Sheet for Healthcare Providers:  IncredibleEmployment.be  This test is no t yet approved or cleared by the [place] FDA and  has been authorized for detection and/or diagnosis of SARS-CoV-2 by FDA under an Emergency Use Authorization (EUA). This EUA will remain  in effect (meaning this test can be used) for the duration of the COVID-19 declaration under Section  564(b)(1) of the Act, 21 U.S.C.section 360bbb-3(b)(1), unless the authorization is terminated  or revoked sooner.       Influenza A by PCR NEGATIVE NEGATIVE Final   Influenza B by PCR NEGATIVE NEGATIVE Final    Comment: (NOTE) The Xpert Xpress SARS-CoV-2/FLU/RSV plus assay is intended as an aid in the diagnosis of influenza from Nasopharyngeal swab specimens and should not be used as a sole basis for treatment. Nasal washings and aspirates are unacceptable for Xpert Xpress SARS-CoV-2/FLU/RSV testing.  Fact Sheet for Patients: EntrepreneurPulse.com.au  Fact Sheet for Healthcare Providers: IncredibleEmployment.be  This test is not yet approved or cleared by the [place] FDA and has been authorized for detection and/or diagnosis of SARS-CoV-2 by FDA under an Emergency Use Authorization (EUA). This EUA will remain in effect (meaning this test can be used) for the duration of the COVID-19 declaration under Section 564(b)(1) of the Act, 21 U.S.C. section 360bbb-3(b)(1), unless the authorization is terminated or revoked.  Performed at [facility], [address], [place], [place] [address]          Radiology Studies: CT ABDOMEN PELVIS WO CONTRAST  Result Date: 09/25/2021 CLINICAL DATA:  Acute abdominal pain.  Weakness. EXAM: CT ABDOMEN AND PELVIS WITHOUT CONTRAST TECHNIQUE: Multidetector CT imaging of the abdomen and pelvis was performed following the standard protocol without IV contrast. RADIATION DOSE REDUCTION: This exam was performed according to the departmental dose-optimization program which includes automated exposure control, adjustment of the mA and/or kV according to patient size and/or use of iterative reconstruction technique. COMPARISON:  Renal ultrasound 02/18/2020 and report from CT abdomen from 01/31/2002 FINDINGS: Lower chest: Mild dependent atelectasis in both lower lobes. Descending thoracic aortic and coronary  artery atherosclerotic calcification. Mild cardiomegaly. Hepatobiliary: Cholecystectomy. Distal CBD proximally 0.7 cm in diameter on image 28 series 2. Pancreas: Unremarkable Spleen: Unremarkable Adrenals/Urinary Tract: Both adrenal glands appear normal. 2.8 cm left kidney upper pole fluid density lesion compatible with cyst as shown on prior ultrasound. 0.8 cm hypodense lesion in the right kidney upper pole as shown on prior ultrasound. No urinary tract calculi observed. Urinary bladder unremarkable. Stomach/Bowel: Fatty deposition in the wall of the ascending colon, this is frequently incidental although does have a weak association with inflammatory bowel disease. Terminal ileum currently appears unremarkable. Appendix unremarkable. Focal hazy opacity in a 1.9 by 1.2 cm lobulation of adipose tissue anterior to the descending colon on image 53 series 12, possibly from appendagitis epiploica or focal fat necrosis. Vascular/Lymphatic: Atherosclerosis is  present, including aortoiliac atherosclerotic disease. No pathologic adenopathy identified. Reproductive: Vascular calcifications along the margins of the uterus. Other: Peritoneal shunt tubing terminates in the left lower quadrant. Musculoskeletal: Mild prominence of epidural adipose tissues in the lower lumbar spine. Mild lower lumbar spondylosis and degenerative disc disease. IMPRESSION: 1. Focal nodular stranding in adipose tissue anterior to the distal descending colon on image 53 of series 2 potentially from appendagitis epiploica or focal fat necrosis. 2. Other imaging findings of potential clinical significance: Mild dependent atelectasis in both lower lobes. Aortic Atherosclerosis (ICD10-I70.0). Coronary atherosclerosis. Mild cardiomegaly. Bilateral renal cysts. Mild lower lumbar spondylosis and degenerative disc disease. Electronically Signed   By: Van Clines M.D.   On: 09/25/2021 11:08   CT HEAD WO CONTRAST (5MM)  Result Date:  09/24/2021 CLINICAL DATA:  Syncope. EXAM: CT HEAD WITHOUT CONTRAST TECHNIQUE: Contiguous axial images were obtained from the base of the skull through the vertex without intravenous contrast. RADIATION DOSE REDUCTION: This exam was performed according to the departmental dose-optimization program which includes automated exposure control, adjustment of the mA and/or kV according to patient size and/or use of iterative reconstruction technique. COMPARISON:  September 20, 2014. FINDINGS: Brain: Right frontal ventriculostomy is noted with distal tip passing through left frontal horn and tip in left basal ganglia. No ventricular dilatation is noted. Aneurysm clip is noted in left basal ganglia region. No mass effect or midline shift is noted. No acute infarction, hemorrhage or mass lesion is noted. Vascular: No hyperdense vessel or unexpected calcification. Skull: Status post right temporal craniotomy. Right frontal ventriculostomy is noted. Sinuses/Orbits: No acute finding. Other: None. IMPRESSION: Right frontal ventriculostomy catheter as described above. No ventricular dilatation is noted. No acute intracranial abnormality seen. Electronically Signed   By: Marijo Conception M.D.   On: 09/24/2021 16:17        Scheduled Meds:  amLODipine  10 mg Oral Daily   cyanocobalamin  1,000 mcg Intramuscular Once   pantoprazole  40 mg Oral BID   polyethylene glycol powder  1 Container Oral Once   Continuous Infusions:  sodium chloride     iron sucrose     lactated ringers 100 mL/hr at 09/26/21 0759     LOS: 1 day       Sidney Ace, MD [facility]   If 7PM-7AM, please contact night-coverage  09/26/2021, 10:01 AM

## 2021-09-27 ENCOUNTER — Inpatient Hospital Stay: Payer: Medicare Other | Admitting: Anesthesiology

## 2021-09-27 ENCOUNTER — Encounter: Payer: Self-pay | Admitting: Gastroenterology

## 2021-09-27 ENCOUNTER — Encounter
Admission: EM | Disposition: A | Discharge status: Home Health | Payer: Self-pay | Source: Home / Self Care | Attending: Internal Medicine

## 2021-09-27 DIAGNOSIS — K635 Polyp of colon: Secondary | ICD-10-CM | POA: Diagnosis not present

## 2021-09-27 DIAGNOSIS — D649 Anemia, unspecified: Secondary | ICD-10-CM | POA: Diagnosis not present

## 2021-09-27 DIAGNOSIS — K317 Polyp of stomach and duodenum: Secondary | ICD-10-CM

## 2021-09-27 HISTORY — PX: ESOPHAGOGASTRODUODENOSCOPY (EGD) WITH PROPOFOL: SHX5813

## 2021-09-27 HISTORY — PX: COLONOSCOPY WITH PROPOFOL: SHX5780

## 2021-09-27 LAB — HEMOGLOBIN
Hemoglobin: 8.2 g/dL — ABNORMAL LOW (ref 12.0–15.0)
Hemoglobin: 8.7 g/dL — ABNORMAL LOW (ref 12.0–15.0)

## 2021-09-27 SURGERY — ESOPHAGOGASTRODUODENOSCOPY (EGD) WITH PROPOFOL
Anesthesia: General

## 2021-09-27 MED ORDER — BOOST / RESOURCE BREEZE PO LIQD CUSTOM
1.0000 | Freq: Three times a day (TID) | ORAL | Status: DC
Start: 2021-09-27 — End: 2021-10-01

## 2021-09-27 MED ORDER — GLYCOPYRROLATE 0.2 MG/ML IJ SOLN
INTRAMUSCULAR | Status: DC | PRN
  Administered 2021-09-27: .2 mg via INTRAVENOUS

## 2021-09-27 MED ORDER — EPHEDRINE SULFATE (PRESSORS) 50 MG/ML IJ SOLN
INTRAMUSCULAR | Status: DC | PRN
  Administered 2021-09-27: 10 mg via INTRAVENOUS
  Administered 2021-09-27: 5 mg via INTRAVENOUS

## 2021-09-27 MED ORDER — EPHEDRINE 5 MG/ML INJ
INTRAVENOUS | Status: AC
  Filled 2021-09-27: qty 5

## 2021-09-27 MED ORDER — PROPOFOL 10 MG/ML IV BOLUS
INTRAVENOUS | Status: DC | PRN
  Administered 2021-09-27: 20 mg via INTRAVENOUS
  Administered 2021-09-27: 50 mg via INTRAVENOUS
  Administered 2021-09-27: 20 mg via INTRAVENOUS

## 2021-09-27 MED ORDER — PROPOFOL 500 MG/50ML IV EMUL
INTRAVENOUS | Status: AC
  Filled 2021-09-27: qty 50

## 2021-09-27 MED ORDER — SODIUM CHLORIDE 0.9 % IV SOLN: INTRAVENOUS | Status: DC

## 2021-09-27 MED ORDER — ADULT MULTIVITAMIN W/MINERALS CH
1.0000 | ORAL_TABLET | Freq: Every day | ORAL | Status: DC
  Administered 2021-09-27 – 2021-10-01 (×5): 1 via ORAL
  Filled 2021-09-27 (×5): qty 1

## 2021-09-27 MED ORDER — LIDOCAINE HCL (CARDIAC) PF 100 MG/5ML IV SOSY
PREFILLED_SYRINGE | INTRAVENOUS | Status: DC | PRN
  Administered 2021-09-27: 50 mg via INTRAVENOUS

## 2021-09-27 MED ORDER — PROPOFOL 500 MG/50ML IV EMUL
INTRAVENOUS | Status: DC | PRN
  Administered 2021-09-27: 120 ug/kg/min via INTRAVENOUS

## 2021-09-27 NOTE — NC FL2 (Signed)
?[place] MEDICAID FL2 LEVEL OF CARE SCREENING TOOL  ?  ? ?IDENTIFICATION  ?Patient Name: ?Yolanda [name] Birthdate: February 19, 1956 Sex: female Admission Date (Current Location): ?09/24/2021  ?[place] and [place] Number: ? [place] ?  Facility and Address:  ?  ?     Provider Number: ?[id number]  ?Attending Physician Name and Address:  ?Sidney Ace, MD ? Relative Name and Phone Number:  ?  ?   ?Current Level of Care: ?Hospital Recommended Level of Care: ?[place] Prior Approval Number: ?  ? ?Date Approved/Denied: ?  PASRR Number: ?[id number] ? ?Discharge Plan: ?SNF ?  ? ?Current Diagnoses: ?Patient Active Problem List  ? Diagnosis Date Noted  ? Polyp of transverse colon   ? Polyp of duodenum   ? Symptomatic anemia 09/24/2021  ? Dementia ([place]) 09/24/2021  ? AKI (acute kidney injury) ([place]) 09/24/2021  ? Claudication ([place]) 12/31/2019  ? Stage 3a chronic kidney disease (CKD) ([facility]) - baseline SCr 1.6 03/06/2015  ? Somnolence 01/23/2015  ? Memory loss 11/26/2014  ? Stroke ([place]) 08/22/2013  ? Preventative health care 12/05/2010  ? Unexplained weight loss 12/03/2010  ? VITAMIN D DEFICIENCY 06/03/2010  ? B12 DEFICIENCY 12/01/2009  ? Hemiplegia as late effect of cerebrovascular accident (CVA) ([place]) - left side hemiplegia 11/23/2009  ? Unspecified visual loss 11/23/2009  ? BRADYCARDIA 11/19/2009  ? Hyperlipidemia 10/25/2007  ? ANXIETY 10/25/2007  ? DEPRESSION 10/25/2007  ? MITRAL INSUFFICIENCY 10/25/2007  ? Essential hypertension 10/25/2007  ? PERIPHERAL VASCULAR DISEASE 10/25/2007  ? ALLERGIC RHINITIS 10/25/2007  ? COPD (chronic obstructive pulmonary disease) ([place]) 10/25/2007  ? GERD 10/25/2007  ? BACK PAIN 10/25/2007  ? OSTEOPOROSIS 10/25/2007  ? Insomnia 10/25/2007  ? Fatigue 10/25/2007  ? Headache(784.0) 10/25/2007  ? History of stroke 10/25/2007  ? ? ?Orientation RESPIRATION BLADDER Height & Weight   ?  ?Self, Place ? Normal Incontinent Weight: 80 kg ?Height:  5\' 7"  (170.2 cm)  ?BEHAVIORAL  SYMPTOMS/MOOD NEUROLOGICAL BOWEL NUTRITION STATUS  ?    Incontinent Diet (Clears)  ?AMBULATORY STATUS COMMUNICATION OF NEEDS Skin   ?Limited Assist Verbally Normal ?  ?  ?  ?    ?     ?     ? ? ?Personal Care Assistance Level of Assistance  ?    ?  ?  ?   ? ?Functional Limitations Info  ?    ?  ?   ? ? ?SPECIAL CARE FACTORS FREQUENCY  ?PT (By licensed PT), OT (By licensed OT)   ?  ?  ?  ?  ?  ?  ?   ? ? ?Contractures Contractures Info: Not present  ? ? ?Additional Factors Info  ?Code Status, Allergies Code Status Info: Full ?Allergies Info: Latex, Ace Inhibitors ?  ?  ?  ?   ? ?Current Medications (09/27/2021):  This is the current hospital active medication list ?Current Facility-Administered Medications  ?Medication Dose Route Frequency Provider Last Rate Last Admin  ? 0.9 %  sodium chloride infusion   Intravenous Continuous Marius Ditch, Tally Due, MD   Stopped at 09/27/21 1210  ? acetaminophen (TYLENOL) tablet 650 mg  650 mg Oral Q6H PRN Kristopher Oppenheim, DO      ? Or  ? acetaminophen (TYLENOL) suppository 650 mg  650 mg Rectal Q6H PRN Kristopher Oppenheim, DO      ? amLODipine (NORVASC) tablet 10 mg  10 mg Oral Daily Kristopher Oppenheim, DO   10 mg at 09/27/21 [id number]  ? lactated ringers infusion  Intravenous Continuous Ralene Muskrat B, MD 100 mL/hr at 09/27/21 0853 New Bag at 09/27/21 0853  ? melatonin tablet 10 mg  10 mg Oral QHS PRN Kristopher Oppenheim, DO   10 mg at 09/26/21 0113  ? ondansetron (ZOFRAN) tablet 4 mg  4 mg Oral Q6H PRN Kristopher Oppenheim, DO      ? Or  ? ondansetron (ZOFRAN) injection 4 mg  4 mg Intravenous Q6H PRN Kristopher Oppenheim, DO      ? pantoprazole (PROTONIX) EC tablet 40 mg  40 mg Oral BID Kristopher Oppenheim, DO   40 mg at 09/27/21 [id number]  ? vitamin B-12 (CYANOCOBALAMIN) tablet 1,000 mcg  1,000 mcg Oral Daily Ralene Muskrat B, MD   1,000 mcg at 09/27/21 [id number]  ? ? ? ?Discharge Medications: ?Please see discharge summary for a list of discharge medications. ? ?Relevant Imaging Results: ? ?Relevant Lab Results: ? ? ?Additional Information ?ss  [id number] ? ?Beverly Sessions, RN ? ? ? ? ?

## 2021-09-27 NOTE — Anesthesia Procedure Notes (Signed)
Procedure Name: [place] ?Date/Time: 09/27/2021 10:55 AM ?Performed by: Tollie Eth, CRNA ?Pre-anesthesia Checklist: Patient identified, Emergency Drugs available, Suction available and Patient being monitored ?Patient Re-evaluated:Patient Re-evaluated prior to induction ?Oxygen Delivery Method: Nasal cannula ?Induction Type: IV induction ?Placement Confirmation: positive ETCO2 ? ? ? ? ?

## 2021-09-27 NOTE — Progress Notes (Signed)
Physical Therapy Treatment ?Patient Details ?Name: Yolanda [name] ?MRN: [id number] ?DOB: 1956/05/18 ?Today's Date: 09/27/2021 ? ? ?History of Present Illness Pt is a 66 y/o F admitted on 09/24/21 after presenting with c/o syncope & fatigue. Pt noted to have Hgb of 7.3 down from 14.1 in Feb 2023. Pt with hx of cognitive decline/dementia. Pt is being treated for acute symptomatic anemia. PMH: stroke with L sided hemiplegia, VP shunt, HTN, COPD, CKD stage 3A ? ?  ?PT Comments  ? ? Pt seen for PT tx with pt reporting need to use bathroom. Pt is able to complete bed mobility with supervision but reliance on hospital bed features & cuing to scoot to EOB to place feet on floor. Pt is able to ambulate bed<>toilet with RW & supervision but decreased safety awareness & proper use of AD. Pt with continent void on toilet then assisted back to bed. Pt is able to move with less assistance but still requires supervision for safety for all mobility tasks. Continue to recommend STR upon d/c. ?   ?Recommendations for follow up therapy are one component of a multi-disciplinary discharge planning process, led by the attending physician.  Recommendations may be updated based on patient status, additional functional criteria and insurance authorization. ? ?Follow Up Recommendations ? Skilled nursing-short term rehab (<3 hours/day) ?  ?  ?Assistance Recommended at Discharge Frequent or constant Supervision/Assistance  ?Patient can return home with the following A little help with walking and/or transfers;A lot of help with bathing/dressing/bathroom;Assistance with cooking/housework;Direct supervision/assist for financial management;Assist for transportation;Direct supervision/assist for medications management;Help with stairs or ramp for entrance ?  ?Equipment Recommendations ? None recommended by PT  ?  ?Recommendations for Other Services   ? ? ?  ?Precautions / Restrictions Precautions ?Precautions: Fall ?Restrictions ?Weight Bearing  Restrictions: No  ?  ? ?Mobility ? Bed Mobility ?Overal bed mobility: Needs Assistance ?Bed Mobility: Supine to Sit, Sit to Supine ?  ?  ?Supine to sit: Supervision, HOB elevated (extra time, use of bed rails, HOB elevated) ?Sit to supine: Supervision, HOB elevated (extra time to elevate BLE onto bed, use of momentum to assist) ?  ?General bed mobility comments: Extra time to scoot to sitting EOB & place BLE feet on floor, cuing to do so ?  ? ?Transfers ?  ?Equipment used: Rolling walker (2 wheels) ?Transfers: Sit to/from Stand ?Sit to Stand: From elevated surface, Supervision ?  ?  ?  ?  ?  ?General transfer comment: cuing for hand placement but pt able to transfer sit>stand from EOB with supervision, also able to transfer sit>stand from toilet with use of grab bar & supervision ?  ? ?Ambulation/Gait ?Ambulation/Gait assistance: Supervision ?Gait Distance (Feet): 10 Feet (+ 10 ft) ?  ?Gait Pattern/deviations: Decreased step length - right, Decreased stride length, Decreased step length - left, Trunk flexed ?  ?  ?  ?General Gait Details: Pushes RW too far out in front of her, decreased safety awareness with mobility with PT managing IV pole. ? ? ?Stairs ?  ?  ?  ?  ?  ? ? ?Wheelchair Mobility ?  ? ?Modified Rankin (Stroke Patients Only) ?  ? ? ?  ?Balance Overall balance assessment: Needs assistance ?Sitting-balance support: Feet supported, Bilateral upper extremity supported ?Sitting balance-Leahy Scale: Fair ?Sitting balance - Comments: supervision static sitting ?  ?Standing balance support: During functional activity, No upper extremity supported, Single extremity supported ?Standing balance-Leahy Scale: Fair ?Standing balance comment: Pt able to stand & perform  peri hygiene with supervision. ?  ?  ?  ?  ?  ?  ?  ?  ?  ?  ?  ?  ? ?  ?Cognition Arousal/Alertness: Awake/alert ?Behavior During Therapy: [facility] for tasks assessed/performed (Pt appreciative of PT & NT's assistance during session.) ?Overall Cognitive  Status: History of cognitive impairments - at baseline ?  ?  ?  ?  ?  ?  ?  ?  ?  ?  ?  ?  ?  ?  ?  ?  ?  ?  ?  ? ?  ?Exercises General Exercises - Lower Extremity ?Straight Leg Raises: AAROM, Strengthening, Both, 5 reps, Supine ? ?  ?General Comments General comments (skin integrity, edema, etc.): Pt with continent void on toilet. Cuing to square up to sink to perform hand hygiene. Assistance with donning clean gown. ?  ?  ? ?Pertinent Vitals/Pain Pain Assessment ?Pain Assessment: Faces ?Faces Pain Scale: Hurts a little bit ?Pain Location: BLE ?Pain Descriptors / Indicators: Grimacing, Guarding ?Pain Intervention(s): Repositioned, Monitored during session  ? ? ?Home Living   ?  ?  ?  ?  ?  ?  ?  ?  ?  ?   ?  ?Prior Function    ?  ?  ?   ? ?PT Goals (current goals can now be found in the care plan section) Acute Rehab PT Goals ?Patient Stated Goal: Go home & walk with cane ?PT Goal Formulation: With patient ?Time For Goal Achievement: 10/09/21 ?Potential to Achieve Goals: Fair ?Progress towards PT goals: Progressing toward goals ? ?  ?Frequency ? ? ? Min 2X/week ? ? ? ?  ?PT Plan Current plan remains appropriate  ? ? ?Co-evaluation   ?  ?  ?  ?  ? ?  ?AM-PAC PT "6 Clicks" Mobility   ?Outcome Measure ? Help needed turning from your back to your side while in a flat bed without using bedrails?: A Little ?Help needed moving from lying on your back to sitting on the side of a flat bed without using bedrails?: A Little ?Help needed moving to and from a bed to a chair (including a wheelchair)?: A Little ?Help needed standing up from a chair using your arms (e.g., wheelchair or bedside chair)?: A Little ?Help needed to walk in hospital room?: A Little ?Help needed climbing 3-5 steps with a railing? : A Lot ?6 Click Score: 17 ? ?  ?End of Session   ?Activity Tolerance: Patient tolerated treatment well ?Patient left: in bed;with call bell/phone within reach;with bed alarm set ?Nurse Communication: Mobility status ?PT Visit  Diagnosis: Muscle weakness (generalized) (M62.81);Difficulty in walking, not elsewhere classified (R26.2);Unsteadiness on feet (R26.81) ?  ? ? ?Time: [id number] ?PT Time Calculation (min) (ACUTE ONLY): 23 min ? ?Charges:  $Therapeutic Activity: 23-37 mins          ?          ? ?Lavone Nian, PT, DPT ?09/27/21, 1:10 PM ? ? ? ?Waunita Schooner ?09/27/2021, 1:09 PM ? ? ? ?

## 2021-09-27 NOTE — Op Note (Signed)
[facility] ?Gastroenterology ?Patient Name: Yolanda [name] ?Procedure Date: 09/27/2021 11:34 AM ?MRN: [id number] ?Account #: [id number] ?Date of Birth: Feb 25, 1956 ?Admit Type: Inpatient ?Age: 66 ?Room: [facility] ENDO ROOM 1 ?Gender: Female ?Note Status: Finalized ?Instrument Name: Colonscope [id number] ?Procedure:             Colonoscopy ?Indications:           Iron deficiency anemia ?Providers:             Lucilla Lame MD, MD ?Referring MD:          Lorna Few (Referring MD) ?Medicines:             Propofol per Anesthesia ?Complications:         No immediate complications. ?Procedure:             Pre-Anesthesia Assessment: ?                       - Prior to the procedure, a History and Physical was  ?                       performed, and patient medications and allergies were  ?                       reviewed. The patient's tolerance of previous  ?                       anesthesia was also reviewed. The risks and benefits  ?                       of the procedure and the sedation options and risks  ?                       were discussed with the patient. All questions were  ?                       answered, and informed consent was obtained. Prior  ?                       Anticoagulants: The patient has taken no previous  ?                       anticoagulant or antiplatelet agents. ASA Grade  ?                       Assessment: II - A patient with mild systemic disease.  ?                       After reviewing the risks and benefits, the patient  ?                       was deemed in satisfactory condition to undergo the  ?                       procedure. ?                       After obtaining informed consent, the colonoscope was  ?  passed under direct vision. Throughout the procedure,  ?                       the patient's blood pressure, pulse, and oxygen  ?                       saturations were monitored continuously. The  ?                       Colonoscope was introduced  through the anus and  ?                       advanced to the the cecum, identified by appendiceal  ?                       orifice and ileocecal valve. ?Findings: ?     The perianal and digital rectal examinations were normal. ?     A 12 mm polyp was found in the transverse colon. The polyp was  ?     pedunculated. The polyp was removed with a hot snare. Resection and  ?     retrieval were complete. ?     Non-bleeding internal hemorrhoids were found during retroflexion. The  ?     hemorrhoids were Grade I (internal hemorrhoids that do not prolapse). ?Impression:            - One 12 mm polyp in the transverse colon, removed  ?                       with a hot snare. Resected and retrieved. ?                       - Non-bleeding internal hemorrhoids. ?Recommendation:        - Return patient to hospital ward for ongoing care. ?                       - Clear liquid diet. ?                       - Continue present medications. ?Procedure Code(s):     --- Professional --- ?                       [phone], Colonoscopy, flexible; with removal of  ?                       tumor(s), polyp(s), or other lesion(s) by snare  ?                       technique ?Diagnosis Code(s):     --- Professional --- ?                       D50.9, Iron deficiency anemia, unspecified ?                       K63.5, Polyp of colon ?CPT copyright 2019 American Medical Association. All rights reserved. ?The codes documented in this report are preliminary and upon coder review may  ?be revised to meet current compliance requirements. ?Lucilla Lame MD, MD ?09/27/2021 12:15:56 PM ?This report has been signed electronically. ?Number of  Addenda: 0 ?Note Initiated On: 09/27/2021 11:34 AM ?Scope Withdrawal Time: 0 hours 5 minutes 29 seconds  ?Total Procedure Duration: 0 hours 11 minutes 58 seconds  ?Estimated Blood Loss:  Estimated blood loss: none. ?     [facility] ?

## 2021-09-27 NOTE — Progress Notes (Signed)
PROGRESS NOTE    Yolanda [name]  [id number] DOB: 1956/06/11 DOA: 09/24/2021 PCP: Lorna Few, DO    Brief Narrative:  66 year old African-American female with a history of stroke with left-sided hemiplegia, history of VP shunt, hypertension, COPD, CKD stage IIIa, who lives at home alone.  Patient reportedly has had fatigue for unknown duration of time.  Patient has a very poor memory.  Based upon labs, she had a CBC performed last month in February 2023 with a hemoglobin of 14.1 g/dL.  Today was checked in the ER and was 7.3.  Patient denies having any rectal bleeding, black stools, melena.  Patient is a poor historian due to her history of prior strokes and aneurysms.  She does have history of cognitive decline/dementia.  History was obtained by speaking with the patient's daughter at bedside.  They are unaware of any rectal bleeding or black stools however the patient lives alone and the exact nature of her bowel movements is unclear.  Anemia Labs reviewed.  Patient with B12 and mild iron deficiency.  Will dose IM B12 and IV Venofer.  GI consulted with plans for endoscopic evaluation.  Can patient complete a MiraLAX prep on 3/12.  Scheduled for endoscopic evaluation with GI on 3/13.   Assessment & Plan:   Principal Problem:   Symptomatic anemia Active Problems:   AKI (acute kidney injury) ([place])   Hemiplegia as late effect of cerebrovascular accident (CVA) ([place]) - left side hemiplegia   Essential hypertension   COPD (chronic obstructive pulmonary disease) (H[facility])   Stage 3a chronic kidney disease (CKD) (H[facility]) - baseline SCr 1.6   Dementia (H[facility])  Acute symptomatic anemia Syncope B12 deficiency Mild iron deficiency anemia Patient has had seven-point drop in hemoglobin over the past month Unclear nature of bowel movements that time No known history of bleeding disorder Only antiplatelet agent is aspirin, no blood thinners B12 and iron studies mildly reduced No clinical  evidence of hemolysis No etiology for blood loss on CT abdomen Status post IM B12, IV Venofer Plan: Continue p.o. PPI twice daily P.o. vitamin B12 We will initiate p.o. ferrous sulfate after endoscopy evaluation Twice daily hemoglobin checks Fall precautions and therapy evaluations N.p.o. for endoscopic evaluation today  AKI on CKD stage IIIa Baseline creatinine 1.6 Creatinine now remaining at 2.04 despite fluids Plan: Continue LR 100 cc/h for today Trend renal function Consider nephrology input  Dementia/cognitive decline History of CVA History of VP shunt Appears stable for now Mental status at baseline Plan: Holding aspirin Continue home Aricept  COPD Appears stable, on room air No evidence of exacerbation Vitals per unit protocol  Essential hypertension Blood pressure currently controlled   DVT prophylaxis: SCDs Code Status: Full Family Communication: Daughter GiGi at bedside 3/11, daughter [name] via phone 3/11. Daughter [name] via phone 3/12 Disposition Plan: Status is: Inpatient Remains inpatient appropriate because: Acute symptomatic anemia.  Suspicion for GI bleed.  Plan for endoscopy on 3/13     Level of care: Med-Surg  Consultants:  None  Procedures:  None  Antimicrobials: None   Subjective: Seen and examined.  Resting comfortably in bed.  No visible distress.  No pain complaints.  Objective: Vitals:   09/26/21 1758 09/26/21 1950 09/27/21 0322 09/27/21 0751  BP: (!) 143/83 122/77 130/79 135/82  Pulse: 78 68 64 65  Resp: 18 18 18 18   Temp: 97.6 F (36.4 C) 98.5 F (36.9 C) 97.8 F (36.6 C) (!) 97.5 F (36.4 C)  TempSrc: Oral  SpO2:  100% 100% 100%  Weight: 80 kg     Height: 5\' 7"  (1.702 m)       Intake/Output Summary (Last 24 hours) at 09/27/2021 0956 Last data filed at 09/27/2021 0750 Gross per 24 hour  Intake 3868.39 ml  Output --  Net 3868.39 ml   Filed Weights   09/26/21 1758  Weight: 80 kg     Examination:  General exam: No acute distress Respiratory system: Lungs clear.  Normal work of breathing.  Room air Cardiovascular system: S1-S2, RRR, no murmurs, no pedal edema Gastrointestinal system: Soft, NT/ND, normal bowel sounds Central nervous system: Alert, oriented x2, no focal deficits Extremities: Symmetric 5 x 5 power. Skin: No rashes, lesions or ulcers Psychiatry: Judgement and insight appear impaired. Mood & affect flattened.     Data Reviewed: I have personally reviewed following labs and imaging studies  CBC: Recent Labs  Lab 09/24/21 1326 09/24/21 2331 09/25/21 0432 09/25/21 1241 09/25/21 1718 09/26/21 0504 09/26/21 1703 09/27/21 0305  WBC 9.0 6.8 5.7  --   --   --   --   --   NEUTROABS  --   --  3.8  --   --   --   --   --   HGB 7.3* 8.5* 7.7* 8.8* 8.4* 7.7* 8.2* 8.2*  HCT 24.0* 26.6* 24.4*  --   --   --   --   --   MCV 85.4 83.9 84.1  --   --   --   --   --   PLT 357 313 260  --   --   --   --   --    Basic Metabolic Panel: Recent Labs  Lab 09/24/21 1326 09/25/21 0432 09/26/21 0504  NA 135 132* 134*  K 4.5 4.2 4.0  CL 107 107 105  CO2 19* 19* 22  GLUCOSE 97 100* 92  BUN 24* 25* 21  CREATININE 2.30* 2.04* 2.04*  CALCIUM 8.8* 8.0* 8.2*  MG  --  2.1 1.9   GFR: Estimated Creatinine Clearance: 28.8 mL/min (A) (by C-G formula based on SCr of 2.04 mg/dL (H)). Liver Function Tests: Recent Labs  Lab 09/25/21 0432 09/26/21 0504  AST 7* 11*  ALT 7 7  ALKPHOS 44 47  BILITOT 1.7* 1.4*  PROT 5.8* 6.2*  ALBUMIN 2.7* 2.9*   No results for input(s): LIPASE, AMYLASE in the last 168 hours. No results for input(s): AMMONIA in the last 168 hours. Coagulation Profile: Recent Labs  Lab 09/24/21 1326 09/25/21 1041  INR 1.0 1.1   Cardiac Enzymes: No results for input(s): CKTOTAL, CKMB, CKMBINDEX, TROPONINI in the last 168 hours. BNP (last 3 results) No results for input(s): PROBNP in the last 8760 hours. HbA1C: No results for input(s):  HGBA1C in the last 72 hours. CBG: No results for input(s): GLUCAP in the last 168 hours. Lipid Profile: No results for input(s): CHOL, HDL, LDLCALC, TRIG, CHOLHDL, LDLDIRECT in the last 72 hours. Thyroid Function Tests: No results for input(s): TSH, T4TOTAL, FREET4, T3FREE, THYROIDAB in the last 72 hours. Anemia Panel: Recent Labs    09/24/21 1548 09/24/21 1602 09/25/21 0758  VITAMINB12  --   --  115*  FOLATE  --   --  8.2  FERRITIN  --   --  31  TIBC  --  329 314  IRON  --  34 67  RETICCTPCT 6.3*  --   --    Sepsis Labs: No results for input(s): PROCALCITON, LATICACIDVEN  in the last 168 hours.  Recent Results (from the past 240 hour(s))  Resp Panel by RT-PCR (Flu A&B, Covid) Nasopharyngeal Swab     Status: None   Collection Time: 09/25/21 12:00 PM   Specimen: Nasopharyngeal Swab; Nasopharyngeal(NP) swabs in vial transport medium  Result Value Ref Range Status   SARS Coronavirus 2 by RT PCR NEGATIVE NEGATIVE Final    Comment: (NOTE) SARS-CoV-2 target nucleic acids are NOT DETECTED.  The SARS-CoV-2 RNA is generally detectable in upper respiratory specimens during the acute phase of infection. The lowest concentration of SARS-CoV-2 viral copies this assay can detect is 138 copies/mL. A negative result does not preclude SARS-Cov-2 infection and should not be used as the sole basis for treatment or other patient management decisions. A negative result may occur with  improper specimen collection/handling, submission of specimen other than nasopharyngeal swab, presence of viral mutation(s) within the areas targeted by this assay, and inadequate number of viral copies(<138 copies/mL). A negative result must be combined with clinical observations, patient history, and epidemiological information. The expected result is Negative.  Fact Sheet for Patients:  EntrepreneurPulse.com.au  Fact Sheet for Healthcare Providers:   IncredibleEmployment.be  This test is no t yet approved or cleared by the [place] FDA and  has been authorized for detection and/or diagnosis of SARS-CoV-2 by FDA under an Emergency Use Authorization (EUA). This EUA will remain  in effect (meaning this test can be used) for the duration of the COVID-19 declaration under Section 564(b)(1) of the Act, 21 U.S.C.section 360bbb-3(b)(1), unless the authorization is terminated  or revoked sooner.       Influenza A by PCR NEGATIVE NEGATIVE Final   Influenza B by PCR NEGATIVE NEGATIVE Final    Comment: (NOTE) The Xpert Xpress SARS-CoV-2/FLU/RSV plus assay is intended as an aid in the diagnosis of influenza from Nasopharyngeal swab specimens and should not be used as a sole basis for treatment. Nasal washings and aspirates are unacceptable for Xpert Xpress SARS-CoV-2/FLU/RSV testing.  Fact Sheet for Patients: EntrepreneurPulse.com.au  Fact Sheet for Healthcare Providers: IncredibleEmployment.be  This test is not yet approved or cleared by the [place] FDA and has been authorized for detection and/or diagnosis of SARS-CoV-2 by FDA under an Emergency Use Authorization (EUA). This EUA will remain in effect (meaning this test can be used) for the duration of the COVID-19 declaration under Section 564(b)(1) of the Act, 21 U.S.C. section 360bbb-3(b)(1), unless the authorization is terminated or revoked.  Performed at [facility], [address] [place],  [address]          Radiology Studies: CT ABDOMEN PELVIS WO CONTRAST  Result Date: 09/25/2021 CLINICAL DATA:  Acute abdominal pain.  Weakness. EXAM: CT ABDOMEN AND PELVIS WITHOUT CONTRAST TECHNIQUE: Multidetector CT imaging of the abdomen and pelvis was performed following the standard protocol without IV contrast. RADIATION DOSE REDUCTION: This exam was performed according to the departmental  dose-optimization program which includes automated exposure control, adjustment of the mA and/or kV according to patient size and/or use of iterative reconstruction technique. COMPARISON:  Renal ultrasound 02/18/2020 and report from CT abdomen from 01/31/2002 FINDINGS: Lower chest: Mild dependent atelectasis in both lower lobes. Descending thoracic aortic and coronary artery atherosclerotic calcification. Mild cardiomegaly. Hepatobiliary: Cholecystectomy. Distal CBD proximally 0.7 cm in diameter on image 28 series 2. Pancreas: Unremarkable Spleen: Unremarkable Adrenals/Urinary Tract: Both adrenal glands appear normal. 2.8 cm left kidney upper pole fluid density lesion compatible with cyst as shown on prior ultrasound. 0.8 cm  hypodense lesion in the right kidney upper pole as shown on prior ultrasound. No urinary tract calculi observed. Urinary bladder unremarkable. Stomach/Bowel: Fatty deposition in the wall of the ascending colon, this is frequently incidental although does have a weak association with inflammatory bowel disease. Terminal ileum currently appears unremarkable. Appendix unremarkable. Focal hazy opacity in a 1.9 by 1.2 cm lobulation of adipose tissue anterior to the descending colon on image 53 series 12, possibly from appendagitis epiploica or focal fat necrosis. Vascular/Lymphatic: Atherosclerosis is present, including aortoiliac atherosclerotic disease. No pathologic adenopathy identified. Reproductive: Vascular calcifications along the margins of the uterus. Other: Peritoneal shunt tubing terminates in the left lower quadrant. Musculoskeletal: Mild prominence of epidural adipose tissues in the lower lumbar spine. Mild lower lumbar spondylosis and degenerative disc disease. IMPRESSION: 1. Focal nodular stranding in adipose tissue anterior to the distal descending colon on image 53 of series 2 potentially from appendagitis epiploica or focal fat necrosis. 2. Other imaging findings of potential  clinical significance: Mild dependent atelectasis in both lower lobes. Aortic Atherosclerosis (ICD10-I70.0). Coronary atherosclerosis. Mild cardiomegaly. Bilateral renal cysts. Mild lower lumbar spondylosis and degenerative disc disease. Electronically Signed   By: Van Clines M.D.   On: 09/25/2021 11:08   US Venous Img Lower Unilateral Right (DVT)  Result Date: 09/26/2021 CLINICAL DATA:  Right lower extremity pain. Evaluate for deep venous thrombosis. EXAM: RIGHT LOWER EXTREMITY VENOUS DOPPLER ULTRASOUND TECHNIQUE: Gray-scale sonography with graded compression, as well as color Doppler and duplex ultrasound were performed to evaluate the lower extremity deep venous systems from the level of the common femoral vein and including the common femoral, femoral, profunda femoral, popliteal and calf veins including the posterior tibial, peroneal and gastrocnemius veins when visible. The superficial great saphenous vein was also interrogated. Spectral Doppler was utilized to evaluate flow at rest and with distal augmentation maneuvers in the common femoral, femoral and popliteal veins. COMPARISON:  None. FINDINGS: Contralateral Common Femoral Vein: Respiratory phasicity is normal and symmetric with the symptomatic side. No evidence of thrombus. Normal compressibility. Common Femoral Vein: No evidence of thrombus. Normal compressibility, respiratory phasicity and response to augmentation. Saphenofemoral Junction: No evidence of thrombus. Normal compressibility and flow on color Doppler imaging. Profunda Femoral Vein: No evidence of thrombus. Normal compressibility and flow on color Doppler imaging. Femoral Vein: No evidence of thrombus. Normal compressibility, respiratory phasicity and response to augmentation. Popliteal Vein: No evidence of thrombus. Normal compressibility, respiratory phasicity and response to augmentation. Calf Veins: No evidence of thrombus. Normal compressibility and flow on color Doppler  imaging. Other Findings:  None. IMPRESSION: Negative for deep venous thrombosis in right lower extremity. Electronically Signed   By: Markus Daft M.D.   On: 09/26/2021 13:31        Scheduled Meds:  amLODipine  10 mg Oral Daily   pantoprazole  40 mg Oral BID   vitamin B-12  1,000 mcg Oral Daily   Continuous Infusions:  sodium chloride 20 mL/hr at 09/26/21 1035   lactated ringers 100 mL/hr at 09/27/21 0853     LOS: 2 days       Sidney Ace, MD [facility]   If 7PM-7AM, please contact night-coverage  09/27/2021, 9:56 AM

## 2021-09-27 NOTE — Progress Notes (Signed)
Mobility Specialist - Progress Note ? ? 09/27/21 1558  ?Mobility  ?Range of Motion/Exercises Active  ?Level of Assistance Minimal assist, patient does 75% or more  ?Assistive Device None  ?Distance Ambulated (ft) 0 ft  ?Activity Response Tolerated well  ?$Mobility charge 1 Mobility  ? ? ?Pt semi-supine in bed upon arrival, family at bedside. Pt declined OOB activity d/t need to void, offered to assist pt to bathroom but pt continued to decline. Pt participated in bed-level therex with active-assist; does make grimacing faces when performing B knee flexion but states "it feels better" with RLE seemingly more limited than LLE. Session limited d/t pt's urge to void, repeating "I don't want to move too much right now cause I feel like I have to pee" although pt encouraged throughout session to urinate while assist is readily available in room. Pt left in bed with alarm set, needs in reach.  ? ? ?Kathee Delton ?Mobility Specialist ?09/27/21, 4:04 PM ? ? ? ? ?

## 2021-09-27 NOTE — Op Note (Signed)
[facility] ?Gastroenterology ?Patient Name: Yolanda [name] ?Procedure Date: 09/27/2021 11:33 AM ?MRN: [id number] ?Account #: [id number] ?Date of Birth: February 17, 1956 ?Admit Type: Inpatient ?Age: 66 ?Room: [facility] ENDO ROOM 1 ?Gender: Female ?Note Status: Finalized ?Instrument Name: Upper Endoscope [id number] ?Procedure:             Upper GI endoscopy ?Indications:           Iron deficiency anemia ?Providers:             Lucilla Lame MD, MD ?Referring MD:          Lorna Few (Referring MD) ?Medicines:             Propofol per Anesthesia ?Complications:         No immediate complications. ?Procedure:             Pre-Anesthesia Assessment: ?                       - Prior to the procedure, a History and Physical was  ?                       performed, and patient medications and allergies were  ?                       reviewed. The patient's tolerance of previous  ?                       anesthesia was also reviewed. The risks and benefits  ?                       of the procedure and the sedation options and risks  ?                       were discussed with the patient. All questions were  ?                       answered, and informed consent was obtained. Prior  ?                       Anticoagulants: The patient has taken no previous  ?                       anticoagulant or antiplatelet agents. ASA Grade  ?                       Assessment: II - A patient with mild systemic disease.  ?                       After reviewing the risks and benefits, the patient  ?                       was deemed in satisfactory condition to undergo the  ?                       procedure. ?                       After obtaining informed consent, the endoscope was  ?  passed under direct vision. Throughout the procedure,  ?                       the patient's blood pressure, pulse, and oxygen  ?                       saturations were monitored continuously. The Endoscope  ?                       was  introduced through the mouth, and advanced to the  ?                       second part of duodenum. The upper GI endoscopy was  ?                       accomplished without difficulty. The patient tolerated  ?                       the procedure well. ?Findings: ?     The examined esophagus was normal. ?     The entire examined stomach was normal. ?     A single 7 mm sessile polyp with no bleeding was found in the second  ?     portion of the duodenum. The polyp was removed with a hot snare.  ?     Resection and retrieval were complete. To prevent bleeding  ?     post-intervention, one hemostatic clip was successfully placed (MR  ?     conditional). There was no bleeding at the end of the procedure. ?Impression:            - Normal esophagus. ?                       - Normal stomach. ?                       - A single duodenal polyp. Resected and retrieved.  ?                       Clip (MR conditional) was placed. ?Recommendation:        - Discharge patient to home. ?                       - Resume previous diet. ?                       - Continue present medications. ?                       - Await pathology results. ?                       - Perform a colonoscopy today. ?Procedure Code(s):     --- Professional --- ?                       [phone], Esophagogastroduodenoscopy, flexible,  ?                       transoral; with removal of tumor(s), polyp(s), or  ?  other lesion(s) by snare technique ?Diagnosis Code(s):     --- Professional --- ?                       D50.9, Iron deficiency anemia, unspecified ?                       K31.7, Polyp of stomach and duodenum ?CPT copyright 2019 American Medical Association. All rights reserved. ?The codes documented in this report are preliminary and upon coder review may  ?be revised to meet current compliance requirements. ?Lucilla Lame MD, MD ?09/27/2021 11:52:34 AM ?This report has been signed electronically. ?Number of Addenda: 0 ?Note Initiated On:  09/27/2021 11:33 AM ?Estimated Blood Loss:  Estimated blood loss: none. ?     [facility] ?

## 2021-09-27 NOTE — TOC Progression Note (Signed)
Transition of Care (TOC) - Progression Note  ? ? ?Patient Details  ?Name: Yolanda [name] ?MRN: [id number] ?Date of Birth: 1955-10-24 ? ?Transition of Care (TOC) CM/SW Contact  ?Chapman Fitch, RN ?Phone Number: ?09/27/2021, 4:03 PM ? ?Clinical Narrative:    ? ?Spoke with Reuel Boom at [facility].  They are not accepting Managed Medicare plans, however he request I fax referral for review to see if they can make an exception  ? ?Referral faxed  ? ?  ?  ? ?Expected Discharge Plan and Services ?  ?  ?  ?  ?  ?                ?  ?  ?  ?  ?  ?  ?  ?  ?  ?  ? ? ?Social Determinants of Health (SDOH) Interventions ?  ? ?Readmission Risk Interventions ?No flowsheet data found. ? ?

## 2021-09-27 NOTE — Anesthesia Postprocedure Evaluation (Signed)
Anesthesia Post Note ? ?Patient: Yolanda [name] ? ?Procedure(s) Performed: ESOPHAGOGASTRODUODENOSCOPY (EGD) WITH PROPOFOL ?COLONOSCOPY WITH PROPOFOL ? ?Patient location during evaluation: Endoscopy ?Anesthesia Type: General ?Level of consciousness: awake and alert ?Pain management: pain level controlled ?Vital Signs Assessment: post-procedure vital signs reviewed and stable ?Respiratory status: spontaneous breathing, nonlabored ventilation and respiratory function stable ?Cardiovascular status: blood pressure returned to baseline and stable ?Postop Assessment: no apparent nausea or vomiting ?Anesthetic complications: no ? ? ?No notable events documented. ? ? ?Last Vitals:  ?Vitals:  ? 09/27/21 1210 09/27/21 1331  ?BP: 96/69 127/73  ?Pulse:  76  ?Resp:  20  ?Temp: 36.8 ?C 36.9 ?C  ?SpO2:  100%  ?  ?Last Pain:  ?Vitals:  ? 09/27/21 1331  ?TempSrc: Oral  ?PainSc:   ? ? ?  ?  ?  ?  ?  ?  ? ?Foye Deer ? ? ? ? ?

## 2021-09-27 NOTE — TOC Progression Note (Signed)
Transition of Care (TOC) - Progression Note  ? ? ?Patient Details  ?Name: Yolanda [name] ?MRN: [id number] ?Date of Birth: 10-Jul-1956 ? ?Transition of Care (TOC) CM/SW Contact  ?Beverly Sessions, RN ?Phone Number: ?09/27/2021, 3:27 PM ? ?Clinical Narrative:    ?Spoke with daughter [name] via phone.  She is in agreement for SNF ? ?Existing [facility] ?Fl2 sent for signature. ?Bed Search initiated ? ?Daughter also request [facility] in [place] as their first choice ? ?VM left for admissions coordinator at [facility]   ? ? ?  ?  ? ?Expected Discharge Plan and Services ?  ?  ?  ?  ?  ?                ?  ?  ?  ?  ?  ?  ?  ?  ?  ?  ? ? ?Social Determinants of Health (SDOH) Interventions ?  ? ?Readmission Risk Interventions ?No flowsheet data found. ? ?

## 2021-09-27 NOTE — Anesthesia Preprocedure Evaluation (Addendum)
Anesthesia Evaluation  ?Patient identified by MRN, date of birth, ID band ?Patient awake ? ?General Assessment Comment:Poor historian ? ?Reviewed: ?Allergy & Precautions, NPO status , Patient's Chart, lab work & pertinent test results ? ?Airway ?Mallampati: III ? ?TM Distance: >3 FB ?Neck ROM: full ? ? ? Dental ? ?(+) Edentulous Upper, Edentulous Lower ?  ?Pulmonary ?COPD, former smoker,  ?  ?Pulmonary exam normal ? ? ? ? ? ? ? Cardiovascular ?Exercise Tolerance: Poor ?hypertension, Pt. on medications ?Normal cardiovascular exam+ Valvular Problems/Murmurs (MR)  ? ? ?  ?Neuro/Psych ?PSYCHIATRIC DISORDERS Dementia history of VP shunt ?CVA (left-sided hemiplegia), Residual Symptoms   ? GI/Hepatic ?Neg liver ROS, GERD  Controlled,  ?Endo/Other  ?negative endocrine ROS ? Renal/GU ?Renal diseaseAKI on CKD stage IIIa  ?negative genitourinary ?  ?Musculoskeletal ? ? Abdominal ?Normal abdominal exam  (+)   ?Peds ? Hematology ? ?(+) Blood dyscrasia, anemia , Acute symptomatic anemia ?B12 deficiency and IDA   ?Anesthesia Other Findings ?Past Medical History: ?10/25/2007: ALLERGIC RHINITIS ?10/25/2007: ANXIETY ?12/01/2009: B12 DEFICIENCY ?10/25/2007: BACK PAIN ?11/19/2009: BRADYCARDIA ?10/25/2007: CEREBROVASCULAR ACCIDENT, HX OF ?12/31/2007: CHEST PAIN ?10/25/2007: COPD ?10/25/2007: DEPRESSION ?10/25/2007: FATIGUE ?10/25/2007: GERD ?10/25/2007: Headache(784.0) ?11/23/2009: HEMIPARESIS, LEFT ?10/25/2007: HYPERLIPIDEMIA ?10/25/2007: HYPERTENSION ?10/25/2007: INSOMNIA-SLEEP DISORDER-UNSPEC ?10/25/2007: MITRAL INSUFFICIENCY ?11/19/2009: OSTEOPENIA ?10/25/2007: OSTEOPOROSIS ?10/25/2007: PERIPHERAL VASCULAR DISEASE ?11/23/2009: Unspecified visual loss ?06/03/2010: VITAMIN D DEFICIENCY ? ?Past Surgical History: ?No date: CHOLECYSTECTOMY ?No date: TUBAL LIGATION ? ?BMI   ? Body Mass Index: 27.62 kg/m?  ?  ? ? Reproductive/Obstetrics ?negative OB ROS ? ?  ? ? ? ? ? ? ? ? ? ? ? ? ? ?  ?  ? ? ? ? ? ? ?Anesthesia Physical ?Anesthesia  Plan ? ?ASA: 3 ? ?Anesthesia Plan: General  ? ?Post-op Pain Management:   ? ?Induction:  ? ?PONV Risk Score and Plan: Propofol infusion and TIVA ? ?Airway Management Planned: Natural Airway and Simple Face Mask ? ?Additional Equipment:  ? ?Intra-op Plan:  ? ?Post-operative Plan:  ? ?Informed Consent: I have reviewed the patients History and Physical, chart, labs and discussed the procedure including the risks, benefits and alternatives for the proposed anesthesia with the patient or authorized representative who has indicated his/her understanding and acceptance.  ? ? ? ?Dental advisory given and Consent reviewed with POA ? ?Plan Discussed with: Anesthesiologist, CRNA and Surgeon ? ?Anesthesia Plan Comments: (Discussed consent with both daughters over the telephone with nurse witness)  ? ? ? ?Anesthesia Quick Evaluation ? ?

## 2021-09-27 NOTE — Progress Notes (Signed)
Initial Nutrition Assessment ? ?DOCUMENTATION CODES:  ? ?Not applicable ? ?INTERVENTION:  ?- Encourage adequate PO intake ? ?- Boost Breeze po TID, each supplement provides 250 kcal and 9 grams of protein ? ?- MVI with minerals daily ? ?NUTRITION DIAGNOSIS:  ? ?Inadequate oral intake related to acute illness as evidenced by meal completion < 50%. ? ?GOAL:  ? ?Patient will meet greater than or equal to 90% of their needs ? ?MONITOR:  ? ?PO intake, Supplement acceptance, Diet advancement, Labs, Weight trends, I & O's ? ?REASON FOR ASSESSMENT:  ? ?Malnutrition Screening Tool ?  ? ?ASSESSMENT:  ? ?Pt admitted from home with syncope and fatigue secondary to symptomatic anemia. PMH includes stroke with L sided hemiplegia, h/o VP shunt, HTN, COPD, CKD stage IIIa and dementia. ? ?Plans to d/c to SNF.  ? ?3/13: s/p upper EGD and colonoscopy- 12 mm polyp found in  ?transverse colon which was resected and retrieved ? ?Unable to reach pt by phone. Reviewed chart. Pt with poor PO intake and would benefit from the addition of nutrition supplements. Will order and adjust as appropriate throughout admission. ? ?Meal completions: ?3/11: 0%-breakfast, 20%-lunch ?3/12: 0%- breakfast ? ?Limited documentation of prior weight history. Last recorded weight PTA was 80.6 kg (12/2019). No known weight loss. Will continue to monitor throughout admission.  ? ?Medications: protonix, Vitamin B12, LR @ 100 ml/hr ? ?Labs: sodium 134, Cr 2.04, Vitamin B12 115 (replacing) ? ?I/O's: +6.4L since admission ? ?NUTRITION - FOCUSED PHYSICAL EXAM: ?RD working remotely. Deferred to follow up. ? ?Diet Order:   ?Diet Order   ? ?       ?  Diet clear liquid Room service appropriate? Yes; Fluid consistency: Thin  Diet effective now       ?  ? ?  ?  ? ?  ? ? ?EDUCATION NEEDS:  ? ?No education needs have been identified at this time ? ?Skin:  Skin Assessment: Reviewed RN Assessment ? ?Last BM:  3/12 (type 7) ? ?Height:  ? ?Ht Readings from Last 1 Encounters:   ?09/27/21 5\' 7"  (1.702 m)  ? ? ?Weight:  ? ?Wt Readings from Last 1 Encounters:  ?09/27/21 80 kg  ? ?BMI:  Body mass index is 27.62 kg/m?. ? ?Estimated Nutritional Needs:  ? ?Kcal:  1800-2000 ? ?Protein:  90-105g ? ?Fluid:  >/=1.8L ? ?Clayborne Dana, RDN, LDN ?Clinical Nutrition ?

## 2021-09-27 NOTE — Transfer of Care (Signed)
Immediate Anesthesia Transfer of Care Note ? ?Patient: KENDRA GRISSETT ? ?Procedure(s) Performed: ESOPHAGOGASTRODUODENOSCOPY (EGD) WITH PROPOFOL ?COLONOSCOPY WITH PROPOFOL ? ?Patient Location: Endoscopy Unit ? ?Anesthesia Type:General ? ?Level of Consciousness: drowsy ? ?Airway & Oxygen Therapy: Patient Spontanous Breathing ? ?Post-op Assessment: Report given to RN and Post -op Vital signs reviewed and stable ? ?Post vital signs: Reviewed and stable ? ?Last Vitals:  ?Vitals Value Taken Time  ?BP 96/69 09/27/21 1209  ?Temp    ?Pulse 94 09/27/21 1210  ?Resp 20 09/27/21 1210  ?SpO2 99 % 09/27/21 1210  ?Vitals shown include unvalidated device data. ? ?Last Pain:  ?Vitals:  ? 09/27/21 1058  ?TempSrc: Temporal  ?PainSc: 0-No pain  ?   ? ?  ? ?Complications: No notable events documented. ?

## 2021-09-28 ENCOUNTER — Encounter: Payer: Self-pay | Admitting: Internal Medicine

## 2021-09-28 ENCOUNTER — Inpatient Hospital Stay: Payer: Medicare Other

## 2021-09-28 DIAGNOSIS — D649 Anemia, unspecified: Secondary | ICD-10-CM | POA: Diagnosis not present

## 2021-09-28 LAB — RETICULOCYTES
Immature Retic Fract: 32.7 % — ABNORMAL HIGH (ref 2.3–15.9)
RBC.: 3.25 MIL/uL — ABNORMAL LOW (ref 3.87–5.11)
Retic Count, Absolute: 225.2 10*3/uL — ABNORMAL HIGH (ref 19.0–186.0)
Retic Ct Pct: 6.9 % — ABNORMAL HIGH (ref 0.4–3.1)

## 2021-09-28 LAB — BASIC METABOLIC PANEL
Anion gap: 11 (ref 5–15)
BUN: 9 mg/dL (ref 8–23)
CO2: 20 mmol/L — ABNORMAL LOW (ref 22–32)
Calcium: 8.2 mg/dL — ABNORMAL LOW (ref 8.9–10.3)
Chloride: 105 mmol/L (ref 98–111)
Creatinine, Ser: 1.83 mg/dL — ABNORMAL HIGH (ref 0.44–1.00)
GFR, Estimated: 30 mL/min — ABNORMAL LOW (ref >60)
Glucose, Bld: 83 mg/dL (ref 70–99)
Potassium: 3.8 mmol/L (ref 3.5–5.1)
Sodium: 136 mmol/L (ref 135–145)

## 2021-09-28 LAB — HEMOGLOBIN
Hemoglobin: 7.2 g/dL — ABNORMAL LOW (ref 12.0–15.0)
Hemoglobin: 8.2 g/dL — ABNORMAL LOW (ref 12.0–15.0)

## 2021-09-28 LAB — PATHOLOGIST SMEAR REVIEW

## 2021-09-28 MED ORDER — FERROUS SULFATE 325 (65 FE) MG PO TABS
325.0000 mg | ORAL_TABLET | Freq: Every day | ORAL | Status: DC
  Administered 2021-09-29 – 2021-10-01 (×3): 325 mg via ORAL
  Filled 2021-09-28 (×3): qty 1

## 2021-09-28 NOTE — Progress Notes (Signed)
Mobility Specialist - Progress Note ? ? 09/28/21 1100  ?Mobility  ?Activity Ambulated with assistance in hallway  ?Level of Assistance Standby assist, set-up cues, supervision of patient - no hands on  ?Assistive Device Front wheel walker  ?Distance Ambulated (ft) 200 ft  ?Activity Response Tolerated well  ?$Mobility charge 1 Mobility  ? ? ? ?Pt sitting in recliner upon arrival, utilizing RA. Pt motivated for OOB activity, but does voice numbness in B feet. Pt ambulated 1 lap around NS---was motivated to complete further ambulation but session limited d/t arrival of IV RN. No complaints. Pt returned to recliner with alarm set, needs in reach.  ? ? ?Yolanda [name] ?Mobility Specialist ?09/28/21, 11:51 AM ? ? ? ? ?

## 2021-09-28 NOTE — Consult Note (Signed)
[place] ?CONSULT NOTE ? ?Patient Care Team: ?Lorna Few, DO as PCP - General (Family Medicine) ? ?CHIEF COMPLAINTS/PURPOSE OF CONSULTATION:  anemia ? ?HISTORY OF PRESENTING ILLNESS: Patient's daughters Yolanda [name] and Yolanda [name] the iPad] ?Yolanda [name] 66 y.o.  female African-American female with a history of stroke with left-sided hemiplegia, history of VP shunt, hypertension, COPD, CKD stage IIIa, history of dementia was brought to the hospital because of worsening fatigue. ? ?Patient had CBC in February 2023 with a hemoglobin of 14-15 at [facility].  On the emergency room noted to have hemoglobin of 7.3.  No obvious bleeding noted. ? ?Patient had extensive work-up for anemia including-CT scan abdomen pelvis negative; also EGD/colonoscopy negative for any acute process or source of bleeding.  ? ?Of note patient noted to have low B12; started on B12 injections.  And also started on IV iron infusion because of low iron saturations.  Patient also needed PRBC transfusion.  Hemoglobin stable around 7.   ? ?Patient denies any significant improvement since she has been here.  Patient complains of numbness in the lower extremities chronic gets worse. ? ?Review of Systems  ?Unable to perform ROS: Dementia   ? ?MEDICAL HISTORY:  ?Past Medical History:  ?Diagnosis Date  ? ALLERGIC RHINITIS 10/25/2007  ? ANXIETY 10/25/2007  ? B12 DEFICIENCY 12/01/2009  ? BACK PAIN 10/25/2007  ? BRADYCARDIA 11/19/2009  ? CEREBROVASCULAR ACCIDENT, HX OF 10/25/2007  ? CHEST PAIN 12/31/2007  ? COPD 10/25/2007  ? DEPRESSION 10/25/2007  ? FATIGUE 10/25/2007  ? GERD 10/25/2007  ? Headache(784.0) 10/25/2007  ? HEMIPARESIS, LEFT 11/23/2009  ? HYPERLIPIDEMIA 10/25/2007  ? HYPERTENSION 10/25/2007  ? INSOMNIA-SLEEP DISORDER-UNSPEC 10/25/2007  ? MITRAL INSUFFICIENCY 10/25/2007  ? OSTEOPENIA 11/19/2009  ? OSTEOPOROSIS 10/25/2007  ? PERIPHERAL VASCULAR DISEASE 10/25/2007  ? Unspecified visual loss 11/23/2009  ? VITAMIN D DEFICIENCY 06/03/2010  ? ? ?SURGICAL HISTORY: ?Past Surgical  History:  ?Procedure Laterality Date  ? CHOLECYSTECTOMY    ? COLONOSCOPY WITH PROPOFOL N/A 09/27/2021  ? Procedure: COLONOSCOPY WITH PROPOFOL;  Surgeon: Lucilla Lame, MD;  Location: [facility] ENDOSCOPY;  Service: Endoscopy;  Laterality: N/A;  ? ESOPHAGOGASTRODUODENOSCOPY (EGD) WITH PROPOFOL N/A 09/27/2021  ? Procedure: ESOPHAGOGASTRODUODENOSCOPY (EGD) WITH PROPOFOL;  Surgeon: Lucilla Lame, MD;  Location: [facility] ENDOSCOPY;  Service: Endoscopy;  Laterality: N/A;  ? TUBAL LIGATION    ? ? ?SOCIAL HISTORY: ?Social History  ? ?Socioeconomic History  ? Marital status: Legally Separated  ?  Spouse name: Not on file  ? Number of children: 3  ? Years of education: Not on file  ? Highest education level: Not on file  ?Occupational History  ? Occupation: former Fish farm manager - disabled since 2005  ?Tobacco Use  ? Smoking status: Former  ? Smokeless tobacco: Never  ?Substance and Sexual Activity  ? Alcohol use: Yes  ? Drug use: No  ? Sexual activity: Not on file  ?Other Topics Concern  ? Not on file  ?Social History Narrative  ? Not on file  ? ?Social Determinants of Health  ? ?Financial Resource Strain: Not on file  ?Food Insecurity: Not on file  ?Transportation Needs: Not on file  ?Physical Activity: Not on file  ?Stress: Not on file  ?Social Connections: Not on file  ?Intimate Partner Violence: Not on file  ? ? ?FAMILY HISTORY: ?Family History  ?Problem Relation Age of Onset  ? Alcohol abuse Other   ?     ETOH Dependence  ? Arthritis Other   ? Heart disease  Other   ? Hypertension Other   ? Stroke Other   ? Depression Other   ? ? ?ALLERGIES:  is allergic to latex and ace inhibitors. ? ?MEDICATIONS:  ?Current Facility-Administered Medications  ?Medication Dose Route Frequency Provider Last Rate Last Admin  ? 0.9 %  sodium chloride infusion   Intravenous Continuous Marius Ditch, Tally Due, MD   Stopped at 09/27/21 1210  ? acetaminophen (TYLENOL) tablet 650 mg  650 mg Oral Q6H PRN Kristopher Oppenheim, DO      ? Or  ? acetaminophen (TYLENOL)  suppository 650 mg  650 mg Rectal Q6H PRN Kristopher Oppenheim, DO      ? amLODipine (NORVASC) tablet 10 mg  10 mg Oral Daily Kristopher Oppenheim, DO   10 mg at 09/28/21 [id number]  ? feeding supplement (BOOST / RESOURCE BREEZE) liquid 1 Container  1 Container Oral TID BM Sreenath, Sudheer B, MD      ? ferrous sulfate tablet 325 mg  325 mg Oral Q breakfast Sreenath, Sudheer B, MD      ? melatonin tablet 10 mg  10 mg Oral QHS PRN Kristopher Oppenheim, DO   10 mg at 09/26/21 0113  ? multivitamin with minerals tablet 1 tablet  1 tablet Oral Daily Ralene Muskrat B, MD   1 tablet at 09/28/21 0936  ? ondansetron (ZOFRAN) tablet 4 mg  4 mg Oral Q6H PRN Kristopher Oppenheim, DO      ? Or  ? ondansetron (ZOFRAN) injection 4 mg  4 mg Intravenous Q6H PRN Kristopher Oppenheim, DO      ? pantoprazole (PROTONIX) EC tablet 40 mg  40 mg Oral BID Kristopher Oppenheim, DO   40 mg at 09/28/21 [id number]  ? vitamin B-12 (CYANOCOBALAMIN) tablet 1,000 mcg  1,000 mcg Oral Daily Ralene Muskrat B, MD   1,000 mcg at 09/28/21 [id number]  ? ? ? ? ?PHYSICAL EXAMINATION: ? ?Vitals:  ? 09/28/21 0742 09/28/21 1557  ?BP: 101/65 113/63  ?Pulse: 65 68  ?Resp: 18 18  ?Temp: 98 ?F (36.7 ?C) 98.4 ?F (36.9 ?C)  ?SpO2: 100% 100%  ? ?Filed Weights  ? 09/26/21 1758 09/27/21 1058  ?Weight: 176 lb 5.9 oz (80 kg) 176 lb 5.9 oz (80 kg)  ? ? ?Physical Exam ?Vitals and nursing note reviewed.  ?HENT:  ?   Head: Normocephalic and atraumatic.  ?   Mouth/Throat:  ?   Pharynx: Oropharynx is clear.  ?Eyes:  ?   Extraocular Movements: Extraocular movements intact.  ?   Pupils: Pupils are equal, round, and reactive to light.  ?Cardiovascular:  ?   Rate and Rhythm: Normal rate and regular rhythm.  ?Pulmonary:  ?   Comments: Decreased breath sounds bilaterally.  ?Abdominal:  ?   Palpations: Abdomen is soft.  ?Musculoskeletal:     ?   General: Normal range of motion.  ?   Cervical back: Normal range of motion.  ?Skin: ?   General: Skin is warm.  ?Neurological:  ?   General: No focal deficit present.  ?   Mental Status: She is alert and  oriented to person, place, and time.  ? ? ? ?LABORATORY DATA:  ?I have reviewed the data as listed ?Lab Results  ?Component Value Date  ? WBC 5.7 09/25/2021  ? HGB 7.2 (L) 09/28/2021  ? HCT 24.4 (L) 09/25/2021  ? MCV 84.1 09/25/2021  ? PLT 260 09/25/2021  ? ?Recent Labs  ?  09/25/21 ?0432 09/26/21 ?[id number] 09/28/21 ?1358  ?NA 132* 134* 136  ?K  4.2 4.0 3.8  ?CL 107 105 105  ?CO2 19* 22 20*  ?GLUCOSE 100* 92 83  ?BUN 25* 21 9  ?CREATININE 2.04* 2.04* 1.83*  ?CALCIUM 8.0* 8.2* 8.2*  ?GFRNONAA 26* 26* 30*  ?PROT 5.8* 6.2*  --   ?ALBUMIN 2.7* 2.9*  --   ?AST 7* 11*  --   ?ALT 7 7  --   ?ALKPHOS 44 47  --   ?BILITOT 1.7* 1.4*  --   ? ? ?RADIOGRAPHIC STUDIES: ?I have personally reviewed the radiological images as listed and agreed with the findings in the report. ?CT ABDOMEN PELVIS WO CONTRAST ? ?Result Date: 09/25/2021 ?CLINICAL DATA:  Acute abdominal pain.  Weakness. EXAM: CT ABDOMEN AND PELVIS WITHOUT CONTRAST TECHNIQUE: Multidetector CT imaging of the abdomen and pelvis was performed following the standard protocol without IV contrast. RADIATION DOSE REDUCTION: This exam was performed according to the departmental dose-optimization program which includes automated exposure control, adjustment of the mA and/or kV according to patient size and/or use of iterative reconstruction technique. COMPARISON:  Renal ultrasound 02/18/2020 and report from CT abdomen from 01/31/2002 FINDINGS: Lower chest: Mild dependent atelectasis in both lower lobes. Descending thoracic aortic and coronary artery atherosclerotic calcification. Mild cardiomegaly. Hepatobiliary: Cholecystectomy. Distal CBD proximally 0.7 cm in diameter on image 28 series 2. Pancreas: Unremarkable Spleen: Unremarkable Adrenals/Urinary Tract: Both adrenal glands appear normal. 2.8 cm left kidney upper pole fluid density lesion compatible with cyst as shown on prior ultrasound. 0.8 cm hypodense lesion in the right kidney upper pole as shown on prior ultrasound. No urinary  tract calculi observed. Urinary bladder unremarkable. Stomach/Bowel: Fatty deposition in the wall of the ascending colon, this is frequently incidental although does have a weak association with inflammatory bowel dis

## 2021-09-28 NOTE — Plan of Care (Signed)
  Problem: Safety: Goal: Ability to remain free from injury will improve Outcome: Not Progressing   

## 2021-09-28 NOTE — Consult Note (Addendum)
? ?Chief Complaint: ?Patient was seen in consultation today for symptomatic anemia at the request of Cammie Sickle, MD ? ?Referring Physician(s): ?Cammie Sickle, MD ? ?Supervising Physician: Sandi Mariscal ? ?Patient Status: [place] - In-pt ? ?History of Present Illness: ?Yolanda [name] is a 66 y.o. female with significant PMHx of stroke with left sided hemiplegia, dementia, history of VP shunt, HTN, COPD, CKD stage 3a. Patient presented to ED via EMS on 3/10 with complaints of weakness with near syncopal episode. Lab work done revealed significant anemia requiring transfusion s/p GI work up with EGD and colonoscopy this admission no active bleeding seen. Request for IR consult and image guided bone marrow biopsy. The patient does have some memory difficulty during our conversation and some of history is obtained per chart review. She denies any current chest pain or shortness of breath. She has no known complications to sedation.  ? ?Past Medical History:  ?Diagnosis Date  ? ALLERGIC RHINITIS 10/25/2007  ? ANXIETY 10/25/2007  ? B12 DEFICIENCY 12/01/2009  ? BACK PAIN 10/25/2007  ? BRADYCARDIA 11/19/2009  ? CEREBROVASCULAR ACCIDENT, HX OF 10/25/2007  ? CHEST PAIN 12/31/2007  ? COPD 10/25/2007  ? DEPRESSION 10/25/2007  ? FATIGUE 10/25/2007  ? GERD 10/25/2007  ? Headache(784.0) 10/25/2007  ? HEMIPARESIS, LEFT 11/23/2009  ? HYPERLIPIDEMIA 10/25/2007  ? HYPERTENSION 10/25/2007  ? INSOMNIA-SLEEP DISORDER-UNSPEC 10/25/2007  ? MITRAL INSUFFICIENCY 10/25/2007  ? OSTEOPENIA 11/19/2009  ? OSTEOPOROSIS 10/25/2007  ? PERIPHERAL VASCULAR DISEASE 10/25/2007  ? Unspecified visual loss 11/23/2009  ? VITAMIN D DEFICIENCY 06/03/2010  ? ? ?Past Surgical History:  ?Procedure Laterality Date  ? CHOLECYSTECTOMY    ? COLONOSCOPY WITH PROPOFOL N/A 09/27/2021  ? Procedure: COLONOSCOPY WITH PROPOFOL;  Surgeon: Lucilla Lame, MD;  Location[facility] ENDOSCOPY;  Service: Endoscopy;  Laterality: N/A;  ? ESOPHAGOGASTRODUODENOSCOPY (EGD) WITH PROPOFOL N/A 09/27/2021  ? Procedure:  ESOPHAGOGASTRODUODENOSCOPY (EGD) WITH PROPOFOL;  Surgeon: Lucilla Lame, MD;  Location: [facility] ENDOSCOPY;  Service: Endoscopy;  Laterality: N/A;  ? TUBAL LIGATION    ? ? ?Allergies: ?Latex and Ace inhibitors ? ?Medications: ?Prior to Admission medications   ?Medication Sig Start Date End Date Taking? Authorizing Provider  ?alendronate (FOSAMAX) 70 MG tablet TAKE 1 TABLET BY MOUTH ONCE A WEEK WITH GLASS OF WATER ON AN EMPTY STOMACH ?Patient taking differently: Take 70 mg by mouth every Tuesday. 05/25/15  Yes Biagio Borg, MD  ?amLODipine (NORVASC) 10 MG tablet Take 1 tablet (10 mg total) by mouth daily. ?Patient taking differently: Take 10 mg by mouth at bedtime. 12/03/14  Yes Biagio Borg, MD  ?aspirin EC 81 MG tablet Take 81 mg by mouth in the morning.   Yes [provider]  ?Calcium Carbonate-Vit D-Min (CALCIUM 600+D3 PLUS MINERALS) 600-800 MG-UNIT TABS Take 1 tablet by mouth daily.   Yes [provider]  ?hydrALAZINE (APRESOLINE) 100 MG tablet Take 100 mg by mouth 2 (two) times daily.   Yes [provider]  ?lovastatin (MEVACOR) 20 MG tablet 3 tabs by mouth per day ?Patient taking differently: Take 20 mg by mouth daily with supper. 12/03/14  Yes Biagio Borg, MD  ?melatonin 3 MG TABS tablet Take 3 mg by mouth at bedtime.   Yes [provider]  ?mirtazapine (REMERON) 7.5 MG tablet Take 7.5 mg by mouth at bedtime.   Yes [provider]  ?potassium chloride SA (KLOR-CON) 20 MEQ tablet Take 20 mEq by mouth in the morning.   Yes [provider]  ?  ? ?  Family History  ?Problem Relation Age of Onset  ? Alcohol abuse Other   ?     ETOH Dependence  ? Arthritis Other   ? Heart disease Other   ? Hypertension Other   ? Stroke Other   ? Depression Other   ? ? ?Social History  ? ?Socioeconomic History  ? Marital status: Legally Separated  ?  Spouse name: Not on file  ? Number of children: 3  ? Years of education: Not on file  ? Highest education level: Not on file   ?Occupational History  ? Occupation: former Fish farm manager - disabled since 2005  ?Tobacco Use  ? Smoking status: Former  ? Smokeless tobacco: Never  ?Substance and Sexual Activity  ? Alcohol use: Yes  ? Drug use: No  ? Sexual activity: Not on file  ?Other Topics Concern  ? Not on file  ?Social History Narrative  ? Not on file  ? ?Social Determinants of Health  ? ?Financial Resource Strain: Not on file  ?Food Insecurity: Not on file  ?Transportation Needs: Not on file  ?Physical Activity: Not on file  ?Stress: Not on file  ?Social Connections: Not on file  ? ?Review of Systems: A 12 point ROS discussed and pertinent positives are indicated in the HPI above.  All other systems are negative. ? ?Review of Systems ? ?Vital Signs: ?BP 101/65 (BP Location: Left Arm)   Pulse 65   Temp 98 ?F (36.7 ?C)   Resp 18   Ht _0  (1.702 m)   Wt 176 lb 5.9 oz (80 kg)   SpO2 100%   BMI 27.62 kg/m?  ? ?Physical Exam ?Constitutional:   ?   General: She is not in acute distress. ?   Appearance: Normal appearance.  ?Cardiovascular:  ?   Rate and Rhythm: Normal rate and regular rhythm.  ?Pulmonary:  ?   Effort: Pulmonary effort is normal. No respiratory distress.  ?Skin: ?   General: Skin is warm and dry.  ?Neurological:  ?   Mental Status: She is alert.  ?   Comments: Oriented to self and place, not to time, has difficulty with memory   ? ? ?Imaging: ?CT ABDOMEN PELVIS WO CONTRAST ? ?Result Date: 09/25/2021 ?CLINICAL DATA:  Acute abdominal pain.  Weakness. EXAM: CT ABDOMEN AND PELVIS WITHOUT CONTRAST TECHNIQUE: Multidetector CT imaging of the abdomen and pelvis was performed following the standard protocol without IV contrast. RADIATION DOSE REDUCTION: This exam was performed according to the departmental dose-optimization program which includes automated exposure control, adjustment of the mA and/or kV according to patient size and/or use of iterative reconstruction technique. COMPARISON:  Renal ultrasound 02/18/2020 and report  from CT abdomen from 01/31/2002 FINDINGS: Lower chest: Mild dependent atelectasis in both lower lobes. Descending thoracic aortic and coronary artery atherosclerotic calcification. Mild cardiomegaly. Hepatobiliary: Cholecystectomy. Distal CBD proximally 0.7 cm in diameter on image 28 series 2. Pancreas: Unremarkable Spleen: Unremarkable Adrenals/Urinary Tract: Both adrenal glands appear normal. 2.8 cm left kidney upper pole fluid density lesion compatible with cyst as shown on prior ultrasound. 0.8 cm hypodense lesion in the right kidney upper pole as shown on prior ultrasound. No urinary tract calculi observed. Urinary bladder unremarkable. Stomach/Bowel: Fatty deposition in the wall of the ascending colon, this is frequently incidental although does have a weak association with inflammatory bowel disease. Terminal ileum currently appears unremarkable. Appendix unremarkable. Focal hazy opacity in a 1.9 by 1.2 cm lobulation of adipose tissue anterior to the descending colon on  image 53 series 12, possibly from appendagitis epiploica or focal fat necrosis. Vascular/Lymphatic: Atherosclerosis is present, including aortoiliac atherosclerotic disease. No pathologic adenopathy identified. Reproductive: Vascular calcifications along the margins of the uterus. Other: Peritoneal shunt tubing terminates in the left lower quadrant. Musculoskeletal: Mild prominence of epidural adipose tissues in the lower lumbar spine. Mild lower lumbar spondylosis and degenerative disc disease. IMPRESSION: 1. Focal nodular stranding in adipose tissue anterior to the distal descending colon on image 53 of series 2 potentially from appendagitis epiploica or focal fat necrosis. 2. Other imaging findings of potential clinical significance: Mild dependent atelectasis in both lower lobes. Aortic Atherosclerosis (ICD10-I70.0). Coronary atherosclerosis. Mild cardiomegaly. Bilateral renal cysts. Mild lower lumbar spondylosis and degenerative disc  disease. Electronically Signed   By: Van Clines M.D.   On: 09/25/2021 11:08  ? ?CT HEAD WO CONTRAST (5MM) ? ?Result Date: 09/24/2021 ?CLINICAL DATA:  Syncope. EXAM: CT HEAD WITHOUT CONTRAST TECHNIQUE: Contiguous axi

## 2021-09-28 NOTE — Care Management Important Message (Signed)
Important Message ? ?Patient Details  ?Name: Yolanda [name] ?MRN: [id number] ?Date of Birth: 23-Aug-1955 ? ? ?Medicare Important Message Given:  Yes ? ?Reviewed Medicare IM with Yolanda [name], daughter, at [phone].  Copy of Medicare IM left in room for reference.   ? ? ? ?Yolanda [name] ?09/28/2021, 10:55 AM ?

## 2021-09-28 NOTE — Progress Notes (Signed)
?PROGRESS NOTE ? ? ? ?Yolanda [name]  [id number] DOB: 06-21-1956 DOA: 09/24/2021 ?PCP: Lorna Few, DO  ? ? ?Brief Narrative:  ?66 year old African-American female with a history of stroke with left-sided hemiplegia, history of VP shunt, hypertension, COPD, CKD stage IIIa, who lives at home alone.  Patient reportedly has had fatigue for unknown duration of time.  Patient has a very poor memory. ? ?Based upon labs, she had a CBC performed last month in February 2023 with a hemoglobin of 14.1 g/dL.  Today was checked in the ER and was 7.3.  Patient denies having any rectal bleeding, black stools, melena. ? ?Patient is a poor historian due to her history of prior strokes and aneurysms.  She does have history of cognitive decline/dementia.  History was obtained by speaking with the patient's daughter at bedside.  They are unaware of any rectal bleeding or black stools however the patient lives alone and the exact nature of her bowel movements is unclear. ? ?Anemia Labs reviewed.  Patient with B12 and mild iron deficiency.  Will dose IM B12 and IV Venofer.  GI consulted with plans for endoscopic evaluation.  Can patient complete a MiraLAX prep on 3/12.  Scheduled for endoscopic evaluation with GI on 3/13. ? ?Underwent colonoscopy and upper EGD on 3/13.  No source of bleeding identified.  1 polyp, biopsied.  Patient stable but hemoglobin remains low.  7.2 as of 3/14.  Hematology engaged for comment ? ? ?Assessment & Plan: ?  ?Principal Problem: ?  Symptomatic anemia ?Active Problems: ?  AKI (acute kidney injury) ([place]) ?  Hemiplegia as late effect of cerebrovascular accident (CVA) ([place]) - left side hemiplegia ?  Essential hypertension ?  COPD (chronic obstructive pulmonary disease) ([place]) ?  Stage 3a chronic kidney disease (CKD) ([facility]) - baseline SCr 1.6 ?  Dementia ([place]) ?  Polyp of transverse colon ?  Polyp of duodenum ? ?Acute symptomatic anemia ?Syncope ?B12 deficiency ?Mild iron deficiency anemia ?Patient has had  seven-point drop in hemoglobin over the past month ?Unclear nature of bowel movements that time ?No known history of bleeding disorder ?Only antiplatelet agent is aspirin, no blood thinners ?B12 and iron studies mildly reduced ?No clinical evidence of hemolysis ?No etiology for blood loss on CT abdomen ?Status post IM B12, IV Venofer ?Status post EGD and colonoscopy 3/13, no bleeding source identified ?Plan: ?Continue p.o. PPI twice daily ?Daily p.o. B12 ?Initiate daily ferrous sulfate 3 and 25 mg every morning ?Twice daily hemoglobins ?Fall precautions and therapy evaluations ?Hematology consult requested ? ?AKI on CKD stage IIIa ?Baseline creatinine 1.6 ?Creatinine now remaining at 2.04 despite fluids ?Pending renal function on 3/14 ?Plan: ?DC IV fluids ?Trend renal function ?Consider nephrology input ? ?Dementia/cognitive decline ?History of CVA ?History of VP shunt ?Appears stable for now ?Mental status at baseline ?Plan: ?Holding aspirin ?Continue home Aricept ? ?COPD ?Appears stable, on room air ?No evidence of exacerbation ?Vitals per unit protocol ? ?Essential hypertension ?Blood pressure currently controlled ? ? ?DVT prophylaxis: SCDs ?Code Status: Full ?Family Communication: Daughter GiGi at bedside 3/11, daughter [name] via phone 3/11. Daughter [name] via phone 3/12, daughter via phone 3/14 ?Disposition Plan: Status is: Inpatient ?Remains inpatient appropriate because: Acute symptomatic anemia.  No GI bleed identified.  Suspect bone marrow suppression.  Hematology work-up in progress. ? ? ? ? ?Level of care: Med-Surg ? ?Consultants:  ?None ? ?Procedures:  ?None ? ?Antimicrobials: ?None ? ? ?Subjective: ?Seen and examined.  Resting comfortably in bed.  No visible distress.  No pain complaints. ? ?Objective: ?Vitals:  ? 09/27/21 1533 09/27/21 1952 09/28/21 0310 09/28/21 0742  ?BP: 124/78 97/64 123/66 101/65  ?Pulse: 74 73 70 65  ?Resp: 18 18 18 18   ?Temp: 98.1 ?F (36.7 ?C) 98.1 ?F (36.7 ?C) 97.7 ?F (36.5 ?C)  98 ?F (36.7 ?C)  ?TempSrc:      ?SpO2: 100% 100% 99% 100%  ?Weight:      ?Height:      ? ? ?Intake/Output Summary (Last 24 hours) at 09/28/2021 1146 ?Last data filed at 09/28/2021 1006 ?Gross per 24 hour  ?Intake 2202.67 ml  ?Output --  ?Net 2202.67 ml  ? ?Filed Weights  ? 09/26/21 1758 09/27/21 1058  ?Weight: 80 kg 80 kg  ? ? ?Examination: ? ?General exam: NAD ?Respiratory system: Lungs clear.  Normal work of breathing.  Room air ?Cardiovascular system: S1-S2, RRR, no murmurs, no pedal edema ?Gastrointestinal system: Soft, NT/ND, normal bowel sounds ?Central nervous system: Alert, oriented x2, no focal deficits ?Extremities: Symmetric 5 x 5 power. ?Skin: No rashes, lesions or ulcers ?Psychiatry: Judgement and insight appear impaired. Mood & affect flattened.  ? ? ? ?Data Reviewed: I have personally reviewed following labs and imaging studies ? ?CBC: ?Recent Labs  ?Lab 09/24/21 ?1326 09/24/21 ?2331 09/25/21 ?0432 09/25/21 ?1241 09/26/21 ?[id number] 09/26/21 ?1703 09/27/21 ?0305 09/27/21 ?1720 09/28/21 ?0423  ?WBC 9.0 6.8 5.7  --   --   --   --   --   --   ?NEUTROABS  --   --  3.8  --   --   --   --   --   --   ?HGB 7.3* 8.5* 7.7*   < > 7.7* 8.2* 8.2* 8.7* 7.2*  ?HCT 24.0* 26.6* 24.4*  --   --   --   --   --   --   ?MCV 85.4 83.9 84.1  --   --   --   --   --   --   ?PLT 357 313 260  --   --   --   --   --   --   ? < > = values in this interval not displayed.  ? ?Basic Metabolic Panel: ?Recent Labs  ?Lab 09/24/21 ?1326 09/25/21 ?0432 09/26/21 ?[id number]  ?NA 135 132* 134*  ?K 4.5 4.2 4.0  ?CL 107 107 105  ?CO2 19* 19* 22  ?GLUCOSE 97 100* 92  ?BUN 24* 25* 21  ?CREATININE 2.30* 2.04* 2.04*  ?CALCIUM 8.8* 8.0* 8.2*  ?MG  --  2.1 1.9  ? ?GFR: ?Estimated Creatinine Clearance: 28.8 mL/min (A) (by C-G formula based on SCr of 2.04 mg/dL (H)). ?Liver Function Tests: ?Recent Labs  ?Lab 09/25/21 ?0432 09/26/21 ?[id number]  ?AST 7* 11*  ?ALT 7 7  ?ALKPHOS 44 47  ?BILITOT 1.7* 1.4*  ?PROT 5.8* 6.2*  ?ALBUMIN 2.7* 2.9*  ? ?No results for input(s):  LIPASE, AMYLASE in the last 168 hours. ?No results for input(s): AMMONIA in the last 168 hours. ?Coagulation Profile: ?Recent Labs  ?Lab 09/24/21 ?1326 09/25/21 ?1041  ?INR 1.0 1.1  ? ?Cardiac Enzymes: ?No results for input(s): CKTOTAL, CKMB, CKMBINDEX, TROPONINI in the last 168 hours. ?BNP (last 3 results) ?No results for input(s): PROBNP in the last 8760 hours. ?HbA1C: ?No results for input(s): HGBA1C in the last 72 hours. ?CBG: ?No results for input(s): GLUCAP in the last 168 hours. ?Lipid Profile: ?No results for input(s): CHOL, HDL, LDLCALC, TRIG, CHOLHDL, LDLDIRECT in the  last 72 hours. ?Thyroid Function Tests: ?No results for input(s): TSH, T4TOTAL, FREET4, T3FREE, THYROIDAB in the last 72 hours. ?Anemia Panel: ?No results for input(s): VITAMINB12, FOLATE, FERRITIN, TIBC, IRON, RETICCTPCT in the last 72 hours. ? ?Sepsis Labs: ?No results for input(s): PROCALCITON, LATICACIDVEN in the last 168 hours. ? ?Recent Results (from the past 240 hour(s))  ?Resp Panel by RT-PCR (Flu A&B, Covid) Nasopharyngeal Swab     Status: None  ? Collection Time: 09/25/21 12:00 PM  ? Specimen: Nasopharyngeal Swab; Nasopharyngeal(NP) swabs in vial transport medium  ?Result Value Ref Range Status  ? SARS Coronavirus 2 by RT PCR NEGATIVE NEGATIVE Final  ?  Comment: (NOTE) ?SARS-CoV-2 target nucleic acids are NOT DETECTED. ? ?The SARS-CoV-2 RNA is generally detectable in upper respiratory ?specimens during the acute phase of infection. The lowest ?concentration of SARS-CoV-2 viral copies this assay can detect is ?138 copies/mL. A negative result does not preclude SARS-Cov-2 ?infection and should not be used as the sole basis for treatment or ?other patient management decisions. A negative result may occur with  ?improper specimen collection/handling, submission of specimen other ?than nasopharyngeal swab, presence of viral mutation(s) within the ?areas targeted by this assay, and inadequate number of viral ?copies(<138 copies/mL). A  negative result must be combined with ?clinical observations, patient history, and epidemiological ?information. The expected result is Negative. ? ?Fact Sheet for Patients:  ?[email or web address].com.cy

## 2021-09-28 NOTE — TOC Progression Note (Signed)
Transition of Care (TOC) - Progression Note  ? ? ?Patient Details  ?Name: Yolanda [name] ?MRN: [id number] ?Date of Birth: [birth year] ? ?Transition of Care (TOC) CM/SW Contact  ?Chapman Fitch, RN ?Phone Number: ?09/28/2021, 2:30 PM ? ?Clinical Narrative:    ? ?Spoke with daughters Maryelizabeth Kaufmann and [place] by phone  ?[facility] is able to offer a bed, however they are not in network and are unable to tell me what patient's out of pocket will be ? ?Local bed offers presented to daughters ? ?Their plan is for patient to return home with resumption of home health order.  ?Patient active with [facility] health.  Resumption orders will need to be faxed to [phone] ? ?Daughter request for EMS transport at discharge.  However patient ambulated 200 feet today with mobility tech.  Notified daughters that at this time patient would not meet medical criteria for EMS transport.  Daughter Maryelizabeth Kaufmann states it will depend on the day patient discharges as to who will be able to pick her up ?  ?  ? ?Expected Discharge Plan and Services ?  ?  ?  ?  ?  ?                ?  ?  ?  ?  ?  ?  ?  ?  ?  ?  ? ? ?Social Determinants of Health (SDOH) Interventions ?  ? ?Readmission Risk Interventions ?No flowsheet data found. ? ?

## 2021-09-28 NOTE — Progress Notes (Signed)
Pt is up in the chair, eating lunch. RN will re consult PIV start, verbalized understanding. ?

## 2021-09-28 NOTE — Progress Notes (Signed)
?Lucilla Lame, MD [facility]   ?[address] ?[place], [place] [address] ?Phone: [phone] ?Fax : [phone] ? ? ?Subjective: ?The patient had no sign of any old blood or fresh blood or any source of blood loss during the EGD and colonoscopy.  The patient did have polyps removed from the duodenum and from the colon.  The patient has had no further sign of any GI bleeding since the procedures.  Her hemoglobin did drop this morning from yesterday ? ? ?Objective: ?Vital signs in last 24 hours: ?Vitals:  ? 09/27/21 1952 09/28/21 0310 09/28/21 0742 09/28/21 1557  ?BP: 97/64 123/66 101/65 113/63  ?Pulse: 73 70 65 68  ?Resp: _0 ?Temp: 98.1 ?F (36.7 ?C) 97.7 ?F (36.5 ?C) 98 ?F (36.7 ?C) 98.4 ?F (36.9 ?C)  ?TempSrc:      ?SpO2: 100% 99% 100% 100%  ?Weight:      ?Height:      ? ?Weight change: 0 kg ? ?Intake/Output Summary (Last 24 hours) at 09/28/2021 1713 ?Last data filed at 09/28/2021 1006 ?Gross per 24 hour  ?Intake 1802.67 ml  ?Output --  ?Net 1802.67 ml  ? ? ? ?Exam: ?Heart:: Regular rate and rhythm, S1S2 present, or without murmur or extra heart sounds ?Lungs: normal and clear to auscultation and percussion ?Abdomen: soft, nontender, normal bowel sounds ? ? ?Lab Results: ?_1 @ ?Micro Results: ?Recent Results (from the past 240 hour(s))  ?Resp Panel by RT-PCR (Flu A&B, Covid) Nasopharyngeal Swab     Status: None  ? Collection Time: 09/25/21 12:00 PM  ? Specimen: Nasopharyngeal Swab; Nasopharyngeal(NP) swabs in vial transport medium  ?Result Value Ref Range Status  ? SARS Coronavirus 2 by RT PCR NEGATIVE NEGATIVE Final  ?  Comment: (NOTE) ?SARS-CoV-2 target nucleic acids are NOT DETECTED. ? ?The SARS-CoV-2 RNA is generally detectable in upper respiratory ?specimens during the acute phase of infection. The lowest ?concentration of SARS-CoV-2 viral copies this assay can detect is ?138 copies/mL. A negative result does not preclude SARS-Cov-2 ?infection and should not be used as the sole basis for  treatment or ?other patient management decisions. A negative result may occur with  ?improper specimen collection/handling, submission of specimen other ?than nasopharyngeal swab, presence of viral mutation(s) within the ?areas targeted by this assay, and inadequate number of viral ?copies(<138 copies/mL). A negative result must be combined with ?clinical observations, patient history, and epidemiological ?information. The expected result is Negative. ? ?Fact Sheet for Patients:  ?EntrepreneurPulse.com.au ? ?Fact Sheet for Healthcare Providers:  ?IncredibleEmployment.be ? ?This test is no t yet approved or cleared by the [place] FDA and  ?has been authorized for detection and/or diagnosis of SARS-CoV-2 by ?FDA under an Emergency Use Authorization (EUA). This EUA will remain  ?in effect (meaning this test can be used) for the duration of the ?COVID-19 declaration under Section 564(b)(1) of the Act, 21 ?U.S.C.section 360bbb-3(b)(1), unless the authorization is terminated  ?or revoked sooner.  ? ? ?  ? Influenza A by PCR NEGATIVE NEGATIVE Final  ? Influenza B by PCR NEGATIVE NEGATIVE Final  ?  Comment: (NOTE) ?The Xpert Xpress SARS-CoV-2/FLU/RSV plus assay is intended as an aid ?in the diagnosis of influenza from Nasopharyngeal swab specimens and ?should not be used as a sole basis for treatment. Nasal washings and ?aspirates are unacceptable for Xpert Xpress SARS-CoV-2/FLU/RSV ?testing. ? ?Fact Sheet for Patients: ?EntrepreneurPulse.com.au ? ?Fact Sheet for Healthcare Providers: ?IncredibleEmployment.be ? ?This test is not yet approved or cleared by the [place]  States FDA and ?has been authorized for detection and/or diagnosis of SARS-CoV-2 by ?FDA under an Emergency Use Authorization (EUA). This EUA will remain ?in effect (meaning this test can be used) for the duration of the ?COVID-19 declaration under Section 564(b)(1) of the Act, 21  U.S.C. ?section 360bbb-3(b)(1), unless the authorization is terminated or ?revoked. ? ?Performed at [facility], [place], ?[place] [address] ?  ? ?Studies/Results: ?US Venous Img Lower Unilateral Left (DVT) ? ?Result Date: 09/28/2021 ?CLINICAL DATA:  Pain and bruising EXAM: LEFT LOWER EXTREMITY VENOUS DOPPLER ULTRASOUND TECHNIQUE: Gray-scale sonography with compression, as well as color and duplex ultrasound, were performed to evaluate the deep venous system(s) from the level of the common femoral vein through the popliteal and proximal calf veins. COMPARISON:  Contralateral 09/26/2021 FINDINGS: VENOUS Normal compressibility of the common femoral, superficial femoral, and popliteal veins, as well as the visualized calf veins. Visualized portions of profunda femoral vein and great saphenous vein unremarkable. No filling defects to suggest DVT on grayscale or color Doppler imaging. Doppler waveforms show normal direction of venous flow, normal respiratory plasticity and response to augmentation. Limited views of the contralateral common femoral vein are unremarkable. OTHER None. Limitations: none IMPRESSION: Negative. Electronically Signed   By: Lucrezia Europe M.D.   On: 09/28/2021 11:20   ?Medications: I have reviewed the patient's current medications. ?Scheduled Meds: ? amLODipine  10 mg Oral Daily  ? feeding supplement  1 Container Oral TID BM  ? ferrous sulfate  325 mg Oral Q breakfast  ? multivitamin with minerals  1 tablet Oral Daily  ? pantoprazole  40 mg Oral BID  ? vitamin B-12  1,000 mcg Oral Daily  ? ?Continuous Infusions: ? sodium chloride Stopped (09/27/21 1210)  ? ?PRN Meds:.acetaminophen **OR** acetaminophen, melatonin, ondansetron **OR** ondansetron (ZOFRAN) IV ? ? ?Assessment: ?Principal Problem: ?  Symptomatic anemia ?Active Problems: ?  Hemiplegia as late effect of cerebrovascular accident (CVA) ([place]) - left side hemiplegia ?  Essential hypertension ?  COPD (chronic obstructive  pulmonary disease) ([place]) ?  Stage 3a chronic kidney disease (CKD) ([facility]) - baseline SCr 1.6 ?  Dementia ([place]) ?  AKI (acute kidney injury) ([place]) ?  Polyp of transverse colon ?  Polyp of duodenum ? ? ? ?Plan: ?The patient had no active sign of any GI bleeding with no source of any recent or old blood. The patient is being set up for a bone marrow biopsy and being followed by hematology.  Nothing further to do from a GI point of view at this time. ? ? LOS: 3 days  ? ?Lucilla Lame, FACG ?09/28/2021, 5:13 PM ?Pager [phone] 7am-5pm  ?Check AMION for 5pm -7am coverage and on weekends ? ?

## 2021-09-29 ENCOUNTER — Inpatient Hospital Stay: Payer: Medicare Other | Admitting: Radiology

## 2021-09-29 ENCOUNTER — Encounter: Payer: Self-pay | Admitting: Internal Medicine

## 2021-09-29 DIAGNOSIS — N179 Acute kidney failure, unspecified: Secondary | ICD-10-CM | POA: Diagnosis not present

## 2021-09-29 DIAGNOSIS — D649 Anemia, unspecified: Secondary | ICD-10-CM | POA: Diagnosis not present

## 2021-09-29 LAB — CBC WITH DIFFERENTIAL/PLATELET
Abs Immature Granulocytes: 0.02 10*3/uL (ref 0.00–0.07)
Basophils Absolute: 0 10*3/uL (ref 0.0–0.1)
Basophils Relative: 1 %
Eosinophils Absolute: 0.1 10*3/uL (ref 0.0–0.5)
Eosinophils Relative: 2 %
HCT: 23.9 % — ABNORMAL LOW (ref 36.0–46.0)
Hemoglobin: 7.7 g/dL — ABNORMAL LOW (ref 12.0–15.0)
Immature Granulocytes: 1 %
Lymphocytes Relative: 24 %
Lymphs Abs: 1 10*3/uL (ref 0.7–4.0)
MCH: 28.1 pg (ref 26.0–34.0)
MCHC: 32.2 g/dL (ref 30.0–36.0)
MCV: 87.2 fL (ref 80.0–100.0)
Monocytes Absolute: 0.4 10*3/uL (ref 0.1–1.0)
Monocytes Relative: 11 %
Neutro Abs: 2.6 10*3/uL (ref 1.7–7.7)
Neutrophils Relative %: 61 %
Platelets: 240 10*3/uL (ref 150–400)
RBC: 2.74 MIL/uL — ABNORMAL LOW (ref 3.87–5.11)
RDW: 18.8 % — ABNORMAL HIGH (ref 11.5–15.5)
WBC: 4.1 10*3/uL (ref 4.0–10.5)
nRBC: 0 % (ref 0.0–0.2)

## 2021-09-29 LAB — HEMOGLOBIN: Hemoglobin: 7.9 g/dL — ABNORMAL LOW (ref 12.0–15.0)

## 2021-09-29 LAB — SURGICAL PATHOLOGY

## 2021-09-29 MED ORDER — FENTANYL CITRATE (PF) 100 MCG/2ML IJ SOLN
INTRAMUSCULAR | Status: AC | PRN
  Administered 2021-09-29: 50 ug via INTRAVENOUS
  Administered 2021-09-29 (×2): 25 ug via INTRAVENOUS

## 2021-09-29 MED ORDER — FENTANYL CITRATE (PF) 100 MCG/2ML IJ SOLN
INTRAMUSCULAR | Status: AC
  Filled 2021-09-29: qty 2

## 2021-09-29 MED ORDER — LIDOCAINE HCL (PF) 1 % IJ SOLN
15.0000 mL | Freq: Once | INTRAMUSCULAR | Status: AC
  Administered 2021-09-29: 15 mL via INTRADERMAL

## 2021-09-29 MED ORDER — MIDAZOLAM HCL 2 MG/2ML IJ SOLN
INTRAMUSCULAR | Status: AC | PRN
Start: 2021-09-29 — End: 2021-09-29
  Administered 2021-09-29 (×2): 1 mg via INTRAVENOUS

## 2021-09-29 MED ORDER — HEPARIN SOD (PORK) LOCK FLUSH 100 UNIT/ML IV SOLN
INTRAVENOUS | Status: AC
  Filled 2021-09-29: qty 5

## 2021-09-29 MED ORDER — MIDAZOLAM HCL 2 MG/2ML IJ SOLN
INTRAMUSCULAR | Status: AC
  Filled 2021-09-29: qty 2

## 2021-09-29 NOTE — Progress Notes (Signed)
Mobility Specialist - Progress Note ? ? 09/29/21 1600  ?Mobility  ?Activity Ambulated with assistance in hallway;Transferred from chair to bed  ?Level of Assistance Standby assist, set-up cues, supervision of patient - no hands on  ?Assistive Device Front wheel walker  ?Distance Ambulated (ft) 200 ft  ?Activity Response Tolerated well  ?$Mobility charge 1 Mobility  ? ? ? ?Pt sitting in recliner upon arrival, utilizing RA. Voiced numbness in BLE. Pt ambulated in hallway with supervision, no LOB. Pt returned to bed with alarm set, needs in reach.  ? ? ?Yolanda [name] ?Mobility Specialist ?09/29/21, 4:11 PM ? ? ? ? ?

## 2021-09-29 NOTE — Progress Notes (Signed)
Occupational Therapy Treatment ?Patient Details ?Name: Yolanda [name] ?MRN: [id number] ?DOB: July 22, 1955 ?Today's Date: 09/29/2021 ? ? ?History of present illness Pt is a 66 y/o F admitted on 09/24/21 after presenting with c/o syncope & fatigue. Pt noted to have Hgb of 7.3 down from 14.1 in Feb 2023. Pt with hx of cognitive decline/dementia. Pt is being treated for acute symptomatic anemia. PMH: stroke with L sided hemiplegia, VP shunt, HTN, COPD, CKD stage 3A ?  ?OT comments ? Yolanda [name] was seen for OT/Pt co- treatment on this date. Upon arrival to room pt awake/alert, semi-supine in bed. Pt agreeable to OT tx session with gentle encouragement. She engages in ADL tasks as described below. Pt continues to be functionally limited by impaired cognition, decreased activity tolerance, and generalized weakness. She requires MIN A for initial bed mobility, but is able to progress to +2 SBA for functional mobility and standing ADL management. Pt making good progress toward OT goals & continues to benefit from skilled OT services to maximize return to PLOF and minimize risk of future falls, injury, caregiver burden, and readmission. Will continue to follow POC. Discharge recommendation updated to [facility] as pt functional status has improved since admission.  ?  ? ?Recommendations for follow up therapy are one component of a multi-disciplinary discharge planning process, led by the attending physician.  Recommendations may be updated based on patient status, additional functional criteria and insurance authorization. ?   ?Follow Up Recommendations ? Home health OT  ?  ?Assistance Recommended at Discharge Frequent or constant Supervision/Assistance  ?Patient can return home with the following ? A little help with walking and/or transfers;Assistance with cooking/housework;Help with stairs or ramp for entrance;Assist for transportation;Direct supervision/assist for medications management;Direct supervision/assist for financial  management;A little help with bathing/dressing/bathroom ?  ?Equipment Recommendations ?    ?  ?Recommendations for Other Services   ? ?  ?Precautions / Restrictions Precautions ?Precautions: Fall ?Restrictions ?Weight Bearing Restrictions: No  ? ? ?  ? ?Mobility Bed Mobility ?Overal bed mobility: Needs Assistance ?Bed Mobility: Supine to Sit ?  ?  ?Supine to sit: Min assist, HOB elevated ?  ?  ?  ?  ? ?Transfers ?  ?  ?  ?  ?  ?  ?  ?  ?  ?  ?  ?  ?Balance Overall balance assessment: Needs assistance ?Sitting-balance support: Feet supported, Bilateral upper extremity supported ?Sitting balance-Leahy Scale: Fair ?Sitting balance - Comments: supervision static sitting ?  ?Standing balance support: During functional activity, No upper extremity supported, Single extremity supported ?Standing balance-Leahy Scale: Fair ?Standing balance comment: Pt able to stand & perform peri hygiene with supervision. ?  ?  ?  ?  ?  ?  ?  ?  ?  ?  ?  ?   ? ?ADL either performed or assessed with clinical judgement  ? ?ADL Overall ADL's : Needs assistance/impaired ?  ?  ?  ?  ?  ?  ?  ?  ?  ?  ?  ?  ?  ?  ?  ?  ?  ?  ?  ?General ADL Comments: Pt requires MIN A for sup>sit t/f. Close SBA +2 for functional mobility, standing grooming tasks at sink, toilet transfer and toileting. Pt is able to perform peri-care with close supervision. She requires consistent cueing for safety t/o session and intermittent re-direction to task/topic at hand. ?  ? ?Extremity/Trunk Assessment Upper Extremity Assessment ?Upper Extremity Assessment: Generalized weakness ?  ?  Lower Extremity Assessment ?Lower Extremity Assessment: Generalized weakness ?  ?  ?  ? ?Vision Baseline Vision/History: 1 Wears glasses ?Ability to See in Adequate Light: 1 Impaired ?Patient Visual Report: No change from baseline ?  ?  ?Perception   ?  ?Praxis   ?  ? ?Cognition Arousal/Alertness: Awake/alert ?Behavior During Therapy: Yolanda [name] for tasks assessed/performed ?Overall Cognitive Status:  History of cognitive impairments - at baseline ?  ?  ?  ?  ?  ?  ?  ?  ?  ?  ?  ?  ?  ?  ?  ?  ?General Comments: Pt remains pleasantly confused t/o session. ?  ?  ?   ?Exercises Other Exercises ?Other Exercises: OT facilitated seated UB grooming, standing grooming at sink, toilet transfer and toileting. ? ?  ?Shoulder Instructions   ? ? ?  ?General Comments    ? ? ?Pertinent Vitals/ Pain       Pain Assessment ?Faces Pain Scale: Hurts a little bit ?Pain Location: BLE ?Pain Descriptors / Indicators: Grimacing, Guarding ?Pain Intervention(s): Monitored during session, Repositioned ? ?Home Living   ?  ?  ?  ?  ?  ?  ?  ?  ?  ?  ?  ?  ?  ?  ?  ?  ?  ?  ? ?  ?Prior Functioning/Environment    ?  ?  ?  ?   ? ?Frequency ? Min 2X/week  ? ? ? ? ?  ?Progress Toward Goals ? ?OT Goals(current goals can now be found in the care plan section) ? Progress towards OT goals: Progressing toward goals ? ?Acute Rehab OT Goals ?Patient Stated Goal: To go home ?OT Goal Formulation: With patient ?Time For Goal Achievement: 10/09/21 ?Potential to Achieve Goals: Fair  ?Plan Discharge plan needs to be updated;Frequency remains appropriate   ? ?Co-evaluation ? ? ? PT/OT/SLP Co-Evaluation/Treatment: Yes ?Reason for Co-Treatment: Necessary to address cognition/behavior during functional activity;For patient/therapist safety;To address functional/ADL transfers ?PT goals addressed during session: Mobility/safety with mobility;Balance ?OT goals addressed during session: ADL's and self-care;Proper use of Adaptive equipment and DME ?  ? ?  ?AM-PAC OT "6 Clicks" Daily Activity     ?Outcome Measure ? ? Help from another person eating meals?: A Little ?Help from another person taking care of personal grooming?: A Little ?Help from another person toileting, which includes using toliet, bedpan, or urinal?: A Little ?Help from another person bathing (including washing, rinsing, drying)?: A Little ?Help from another person to put on and taking off regular  upper body clothing?: A Little ?Help from another person to put on and taking off regular lower body clothing?: A Little ?6 Click Score: 18 ? ?  ?End of Session Equipment Utilized During Treatment: Gait belt;Rolling walker (2 wheels) ? ?OT Visit Diagnosis: Other abnormalities of gait and mobility (R26.89);Muscle weakness (generalized) (M62.81) ?  ?Activity Tolerance Patient tolerated treatment well ?  ?Patient Left in chair;with call bell/phone within reach;with chair alarm set ?  ?Nurse Communication Mobility status ?  ? ?   ? ?Time: [id number] ?OT Time Calculation (min): 27 min ? ?Charges: OT General Charges ?$OT Visit: 1 Visit ?OT Evaluation ?$OT Eval Moderate Complexity: 1 Mod ?OT Treatments ?$Self Care/Home Management : 8-22 mins ? ?Shara Blazing, M.S., OTR/L ?[facility] - [facility] ?Ascom: [id number] ?09/29/21, 4:01 PM ? ?

## 2021-09-29 NOTE — Progress Notes (Signed)
Physical Therapy Treatment ?Patient Details ?Name: Yolanda [name] ?MRN: [id number] ?DOB: 1955-12-21 ?Today's Date: 09/29/2021 ? ? ?History of Present Illness Pt is a 66 y/o F admitted on 09/24/21 after presenting with c/o syncope & fatigue. Pt noted to have Hgb of 7.3 down from 14.1 in Feb 2023. Pt with hx of cognitive decline/dementia. Pt is being treated for acute symptomatic anemia. PMH: stroke with L sided hemiplegia, VP shunt, HTN, COPD, CKD stage 3A ? ?  ?PT Comments  ? ? Pt is making gradual progress towards goals with ability to ambulate around room and in bathroom to perform ADLs. Pt reports she is too fatigued to ambulate further this date. Co treat performed with OT as pt was out of room for biopsy this morning. Will continue to progress as able.  ?Recommendations for follow up therapy are one component of a multi-disciplinary discharge planning process, led by the attending physician.  Recommendations may be updated based on patient status, additional functional criteria and insurance authorization. ? ?Follow Up Recommendations ? Skilled nursing-short term rehab (<3 hours/day) ?  ?  ?Assistance Recommended at Discharge Frequent or constant Supervision/Assistance  ?Patient can return home with the following A little help with walking and/or transfers;A lot of help with bathing/dressing/bathroom;Assistance with cooking/housework;Direct supervision/assist for financial management;Assist for transportation;Direct supervision/assist for medications management;Help with stairs or ramp for entrance ?  ?Equipment Recommendations ? None recommended by PT  ?  ?Recommendations for Other Services   ? ? ?  ?Precautions / Restrictions Precautions ?Precautions: Fall ?Restrictions ?Weight Bearing Restrictions: No  ?  ? ?Mobility ? Bed Mobility ?Overal bed mobility: Needs Assistance ?Bed Mobility: Supine to Sit ?  ?  ?Supine to sit: Min assist, HOB elevated ?  ?  ?General bed mobility comments: slow movement with multiple  pauses throughout session "be patient". CUes for sequencing including scooting out towards EOB. Once seated at EOB, upright posture noted ?  ? ?Transfers ?Overall transfer level: Needs assistance ?Equipment used: Rolling walker (2 wheels) ?Transfers: Sit to/from Stand ?Sit to Stand: Supervision ?  ?  ?  ?  ?  ?General transfer comment: cues for technique. Upright posture noted once standing with RW ?  ? ?Ambulation/Gait ?Ambulation/Gait assistance: Min guard ?Gait Distance (Feet): 40 Feet ?Assistive device: Rolling walker (2 wheels) ?Gait Pattern/deviations: Step-through pattern ?  ?  ?  ?General Gait Details: needs cues for safety and AD use. Fatigued this date and prefers not to ambulate further distances at this time ? ? ?Stairs ?  ?  ?  ?  ?  ? ? ?Wheelchair Mobility ?  ? ?Modified Rankin (Stroke Patients Only) ?  ? ? ?  ?Balance Overall balance assessment: Needs assistance ?Sitting-balance support: Feet supported, Bilateral upper extremity supported ?Sitting balance-Leahy Scale: Good ?  ?  ?Standing balance support: During functional activity, No upper extremity supported, Single extremity supported ?Standing balance-Leahy Scale: Fair ?  ?  ?  ?  ?  ?  ?  ?  ?  ?  ?  ?  ?  ? ?  ?Cognition Arousal/Alertness: Awake/alert ?Behavior During Therapy: [facility] for tasks assessed/performed ?Overall Cognitive Status: History of cognitive impairments - at baseline ?  ?  ?  ?  ?  ?  ?  ?  ?  ?  ?  ?  ?  ?  ?  ?  ?General Comments: pleasantly confused, agreeable to therapy session ?  ?  ? ?  ?Exercises Other Exercises ?Other Exercises: seated ther-ex performed  while in recliner including AP and quad sets. 10 reps with supervision. Also ambulated to bathroom to perform ADLs with cga ? ?  ?General Comments   ?  ?  ? ?Pertinent Vitals/Pain Pain Assessment ?Pain Assessment: No/denies pain  ? ? ?Home Living   ?  ?  ?  ?  ?  ?  ?  ?  ?  ?   ?  ?Prior Function    ?  ?  ?   ? ?PT Goals (current goals can now be found in the care plan  section) Acute Rehab PT Goals ?Patient Stated Goal: rest today ?PT Goal Formulation: With patient ?Time For Goal Achievement: 10/09/21 ?Potential to Achieve Goals: Fair ?Progress towards PT goals: Progressing toward goals ? ?  ?Frequency ? ? ? Min 2X/week ? ? ? ?  ?PT Plan Current plan remains appropriate  ? ? ?Co-evaluation PT/OT/SLP Co-Evaluation/Treatment: Yes ?Reason for Co-Treatment: To address functional/ADL transfers ?PT goals addressed during session: Mobility/safety with mobility;Proper use of DME ?OT goals addressed during session: ADL's and self-care ?  ? ?  ?AM-PAC PT "6 Clicks" Mobility   ?Outcome Measure ? Help needed turning from your back to your side while in a flat bed without using bedrails?: A Little ?Help needed moving from lying on your back to sitting on the side of a flat bed without using bedrails?: A Little ?Help needed moving to and from a bed to a chair (including a wheelchair)?: A Little ?Help needed standing up from a chair using your arms (e.g., wheelchair or bedside chair)?: A Little ?Help needed to walk in hospital room?: A Little ?Help needed climbing 3-5 steps with a railing? : A Lot ?6 Click Score: 17 ? ?  ?End of Session   ?Activity Tolerance: Patient limited by fatigue ?Patient left: in chair;with chair alarm set ?Nurse Communication: Mobility status ?PT Visit Diagnosis: Muscle weakness (generalized) (M62.81);Difficulty in walking, not elsewhere classified (R26.2);Unsteadiness on feet (R26.81) ?  ? ? ?Time: [id number] ?PT Time Calculation (min) (ACUTE ONLY): 25 min ? ?Charges:  $Therapeutic Activity: 8-22 mins          ?          ? ?Yolanda [name], PT, DPT, GCS ?[phone] ? ? ? ?Yolanda [name] ?09/29/2021, 4:49 PM ? ?

## 2021-09-29 NOTE — Procedures (Signed)
Interventional Radiology Procedure Note ? ?Date of Procedure: 09/29/2021  ?Procedure: BMBx ? ?Findings:  ?1. BMBx right posterior ilium using fluoro   ? ?Complications: No immediate complications noted.  ? ?Estimated Blood Loss: minimal ? ?Follow-up and Recommendations: ?1. Bedrest 1 hour  ? ? ?Olive Bass, MD  ?Vascular & Interventional Radiology  ?09/29/2021 10:32 AM ? ? ? ?

## 2021-09-29 NOTE — Progress Notes (Signed)
? ? ? ?Progress Note  ? ? ?[name]  IWP:[id number] DOB: [birth year]  DOA: 09/24/2021 ?PCP: Lorna Few, DO  ? ? ? ? ?Brief Narrative:  ? ? ?Medical records reviewed and are as summarized below: ? ?Yolanda [name] is a 66 y.o. female with medical history of stroke with left-sided hemiplegia, history of VP shunt, cognitive decline, hypertension, COPD, CKD stage IIIa, who lives at home alone.  She presented to the hospital because of fatigue.  Hemoglobin was 14.1 on 08/31/2021. ? ?On admission, she was found to have a hemoglobin of 7.3.  She had EGD and colonoscopy which did not reveal any source of bleeding.  Hematologist was consulted and patient underwent bone marrow biopsy for further evaluation. ? ? ? ?Assessment/Plan:  ? ?Principal Problem: ?  Symptomatic anemia ?Active Problems: ?  AKI (acute kidney injury) ([place]) ?  Hemiplegia as late effect of cerebrovascular accident (CVA) ([place]) - left side hemiplegia ?  Essential hypertension ?  COPD (chronic obstructive pulmonary disease) ([place]) ?  Stage 3a chronic kidney disease (CKD) ([facility]) - baseline SCr 1.6 ?  Dementia ([place]) ?  Polyp of transverse colon ?  Polyp of duodenum ? ? ?Nutrition Problem: Inadequate oral intake ?Etiology: acute illness ? ?Signs/Symptoms: meal completion < 50% ? ? ?Body mass index is 27.62 kg/m?. ? ? ?Acute symptomatic anemia, vitamin B12 deficiency, mild iron deficiency: S/p bone marrow biopsy on 09/29/2021.  S/p EGD and colonoscopy on 09/27/2021 which did not show any source of bleeding.  Continue Protonix. No evidence of hemolysis.  Follow-up with hematologist. ? ?AKI on CKD stage IIIa: Baseline creatinine around 1.6.  Creatinine is slowly improving.  Monitor BMP. ? ?Cognitive impairment/? dementia, history of stroke, history of VP shunt: Aspirin on hold.  Continue Aricept. ? ?Other comorbidities include hypertension, COPD ? ? ?Diet Order   ? ?       ?  Diet regular Room service appropriate? Yes; Fluid consistency: Thin  Diet effective now        ?  ? ?  ?  ? ?  ? ? ? ? ? ? ?Consultants: ?Gastroenterologist ?Hematologist ?Interventional radiologist ? ?Procedures: ?Bone marrow biopsy on 09/29/2021 ? ? ? ?Medications:  ? ? amLODipine  10 mg Oral Daily  ? feeding supplement  1 Container Oral TID BM  ? fentaNYL      ? ferrous sulfate  325 mg Oral Q breakfast  ? heparin lock flush      ? midazolam      ? multivitamin with minerals  1 tablet Oral Daily  ? pantoprazole  40 mg Oral BID  ? vitamin B-12  1,000 mcg Oral Daily  ? ?Continuous Infusions: ? sodium chloride Stopped (09/29/21 1038)  ? ? ? ?Anti-infectives (From admission, onward)  ? ? None  ? ?  ? ? ? ? ? ? ? ? ? ?Family Communication/Anticipated D/C date and plan/Code Status  ? ?DVT prophylaxis: SCDs Start: 09/24/21 2147 ? ? ?  Code Status: Full Code ? ?Family Communication: None ?Disposition Plan: Possible discharge to home tomorrow ? ? ?Status is: Inpatient ?Remains inpatient appropriate because: Generalized weakness ? ? ? ? ? ? ?Subjective:  ? ?Interval events noted.  She had just come back from bone biopsy.  Radiology nurse was at the bedside.  She complains of fatigue.  No rectal bleeding, vomiting, abdominal pain, dizziness, chest pain or shortness of breath ? ?Objective:  ? ? ?Vitals:  ? 09/29/21 1015 09/29/21 1030 09/29/21 1045  09/29/21 1100  ?BP: 114/67 106/63 98/60 106/68  ?Pulse: 61 (!) 56 (!) 59 62  ?Resp: $Remov'15 12 12 14  'gkHXNT$ ?Temp:      ?TempSrc:      ?SpO2: 100% 96% 95% 96%  ?Weight:      ?Height:      ? ?No data found. ? ? ?Intake/Output Summary (Last 24 hours) at 09/29/2021 1443 ?Last data filed at 09/29/2021 1407 ?Gross per 24 hour  ?Intake 476 ml  ?Output --  ?Net 476 ml  ? ?Filed Weights  ? 09/26/21 1758 09/27/21 1058  ?Weight: 80 kg 80 kg  ? ? ?Exam: ? ?GEN: NAD ?SKIN: No rash ?EYES: EOMI ?ENT: MMM ?CV: RRR ?PULM: CTA B ?ABD: soft, ND, NT, +BS ?CNS: AAO x 3 ?EXT: No edema or tenderness ? ? ? ?  ? ? ?Data Reviewed:  ? ?I have personally reviewed following labs and imaging  studies: ? ?Labs: ?Labs show the following:  ? ?Basic Metabolic Panel: ?Recent Labs  ?Lab 09/24/21 ?1326 09/25/21 ?0432 09/26/21 ?2025 09/28/21 ?1358  ?NA 135 132* 134* 136  ?K 4.5 4.2 4.0 3.8  ?CL 107 107 105 105  ?CO2 19* 19* 22 20*  ?GLUCOSE 97 100* 92 83  ?BUN 24* 25* 21 9  ?CREATININE 2.30* 2.04* 2.04* 1.83*  ?CALCIUM 8.8* 8.0* 8.2* 8.2*  ?MG  --  2.1 1.9  --   ? ?GFR ?Estimated Creatinine Clearance: 32 mL/min (A) (by C-G formula based on SCr of 1.83 mg/dL (H)). ?Liver Function Tests: ?Recent Labs  ?Lab 09/25/21 ?0432 09/26/21 ?4270  ?AST 7* 11*  ?ALT 7 7  ?ALKPHOS 44 47  ?BILITOT 1.7* 1.4*  ?PROT 5.8* 6.2*  ?ALBUMIN 2.7* 2.9*  ? ?No results for input(s): LIPASE, AMYLASE in the last 168 hours. ?No results for input(s): AMMONIA in the last 168 hours. ?Coagulation profile ?Recent Labs  ?Lab 09/24/21 ?1326 09/25/21 ?1041  ?INR 1.0 1.1  ? ? ?CBC: ?Recent Labs  ?Lab 09/24/21 ?1326 09/24/21 ?2331 09/25/21 ?0432 09/25/21 ?1241 09/27/21 ?0305 09/27/21 ?1720 09/28/21 ?0423 09/28/21 ?1957 09/29/21 ?0450  ?WBC 9.0 6.8 5.7  --   --   --   --   --  4.1  ?NEUTROABS  --   --  3.8  --   --   --   --   --  2.6  ?HGB 7.3* 8.5* 7.7*   < > 8.2* 8.7* 7.2* 8.2* 7.7*  ?HCT 24.0* 26.6* 24.4*  --   --   --   --   --  23.9*  ?MCV 85.4 83.9 84.1  --   --   --   --   --  87.2  ?PLT 357 313 260  --   --   --   --   --  240  ? < > = values in this interval not displayed.  ? ?Cardiac Enzymes: ?No results for input(s): CKTOTAL, CKMB, CKMBINDEX, TROPONINI in the last 168 hours. ?BNP (last 3 results) ?No results for input(s): PROBNP in the last 8760 hours. ?CBG: ?No results for input(s): GLUCAP in the last 168 hours. ?D-Dimer: ?No results for input(s): DDIMER in the last 72 hours. ?Hgb A1c: ?No results for input(s): HGBA1C in the last 72 hours. ?Lipid Profile: ?No results for input(s): CHOL, HDL, LDLCALC, TRIG, CHOLHDL, LDLDIRECT in the last 72 hours. ?Thyroid function studies: ?No results for input(s): TSH, T4TOTAL, T3FREE, THYROIDAB in the  last 72 hours. ? ?Invalid input(s): FREET3 ?Anemia work up: ?Recent Labs  ?  09/28/21 ?1358  ?RETICCTPCT 6.9*  ? ?Sepsis Labs: ?Recent Labs  ?Lab 09/24/21 ?1326 09/24/21 ?6503 09/25/21 ?5465 09/29/21 ?0450  ?WBC 9.0 6.8 5.7 4.1  ? ? ?Microbiology ?Recent Results (from the past 240 hour(s))  ?Resp Panel by RT-PCR (Flu A&B, Covid) Nasopharyngeal Swab     Status: None  ? Collection Time: 09/25/21 12:00 PM  ? Specimen: Nasopharyngeal Swab; Nasopharyngeal(NP) swabs in vial transport medium  ?Result Value Ref Range Status  ? SARS Coronavirus 2 by RT PCR NEGATIVE NEGATIVE Final  ?  Comment: (NOTE) ?SARS-CoV-2 target nucleic acids are NOT DETECTED. ? ?The SARS-CoV-2 RNA is generally detectable in upper respiratory ?specimens during the acute phase of infection. The lowest ?concentration of SARS-CoV-2 viral copies this assay can detect is ?138 copies/mL. A negative result does not preclude SARS-Cov-2 ?infection and should not be used as the sole basis for treatment or ?other patient management decisions. A negative result may occur with  ?improper specimen collection/handling, submission of specimen other ?than nasopharyngeal swab, presence of viral mutation(s) within the ?areas targeted by this assay, and inadequate number of viral ?copies(<138 copies/mL). A negative result must be combined with ?clinical observations, patient history, and epidemiological ?information. The expected result is Negative. ? ?Fact Sheet for Patients:  ?EntrepreneurPulse.com.au ? ?Fact Sheet for Healthcare Providers:  ?IncredibleEmployment.be ? ?This test is no t yet approved or cleared by the [place] FDA and  ?has been authorized for detection and/or diagnosis of SARS-CoV-2 by ?FDA under an Emergency Use Authorization (EUA). This EUA will remain  ?in effect (meaning this test can be used) for the duration of the ?COVID-19 declaration under Section 564(b)(1) of the Act, 21 ?U.S.C.section 360bbb-3(b)(1),  unless the authorization is terminated  ?or revoked sooner.  ? ? ?  ? Influenza A by PCR NEGATIVE NEGATIVE Final  ? Influenza B by PCR NEGATIVE NEGATIVE Final  ?  Comment: (NOTE) ?The Xpert Xpress SARS-CoV-2/FLU/RSV plus ass

## 2021-09-30 ENCOUNTER — Encounter: Payer: Self-pay | Admitting: Gastroenterology

## 2021-09-30 DIAGNOSIS — D649 Anemia, unspecified: Secondary | ICD-10-CM | POA: Diagnosis not present

## 2021-09-30 LAB — CBC WITH DIFFERENTIAL/PLATELET
Abs Immature Granulocytes: 0.02 10*3/uL (ref 0.00–0.07)
Basophils Absolute: 0 10*3/uL (ref 0.0–0.1)
Basophils Relative: 1 %
Eosinophils Absolute: 0.1 10*3/uL (ref 0.0–0.5)
Eosinophils Relative: 3 %
HCT: 25.7 % — ABNORMAL LOW (ref 36.0–46.0)
Hemoglobin: 7.9 g/dL — ABNORMAL LOW (ref 12.0–15.0)
Immature Granulocytes: 1 %
Lymphocytes Relative: 24 %
Lymphs Abs: 1 10*3/uL (ref 0.7–4.0)
MCH: 27.3 pg (ref 26.0–34.0)
MCHC: 30.7 g/dL (ref 30.0–36.0)
MCV: 88.9 fL (ref 80.0–100.0)
Monocytes Absolute: 0.5 10*3/uL (ref 0.1–1.0)
Monocytes Relative: 12 %
Neutro Abs: 2.4 10*3/uL (ref 1.7–7.7)
Neutrophils Relative %: 59 %
Platelets: 240 10*3/uL (ref 150–400)
RBC: 2.89 MIL/uL — ABNORMAL LOW (ref 3.87–5.11)
RDW: 19.1 % — ABNORMAL HIGH (ref 11.5–15.5)
WBC: 3.9 10*3/uL — ABNORMAL LOW (ref 4.0–10.5)
nRBC: 0 % (ref 0.0–0.2)

## 2021-09-30 LAB — BASIC METABOLIC PANEL
Anion gap: 8 (ref 5–15)
BUN: 12 mg/dL (ref 8–23)
CO2: 23 mmol/L (ref 22–32)
Calcium: 8.1 mg/dL — ABNORMAL LOW (ref 8.9–10.3)
Chloride: 103 mmol/L (ref 98–111)
Creatinine, Ser: 1.83 mg/dL — ABNORMAL HIGH (ref 0.44–1.00)
GFR, Estimated: 30 mL/min — ABNORMAL LOW (ref >60)
Glucose, Bld: 98 mg/dL (ref 70–99)
Potassium: 3.5 mmol/L (ref 3.5–5.1)
Sodium: 134 mmol/L — ABNORMAL LOW (ref 135–145)

## 2021-09-30 LAB — PARVOVIRUS B19 ANTIBODY, IGG AND IGM
Parovirus B19 IgG Abs: 0.2 index (ref 0.0–0.8)
Parovirus B19 IgM Abs: 0.1 index (ref 0.0–0.8)

## 2021-09-30 LAB — ERYTHROPOIETIN: Erythropoietin: 61.9 m[IU]/mL — ABNORMAL HIGH (ref 2.6–18.5)

## 2021-09-30 LAB — HEMOGLOBIN: Hemoglobin: 9.3 g/dL — ABNORMAL LOW (ref 12.0–15.0)

## 2021-09-30 LAB — PREPARE RBC (CROSSMATCH)

## 2021-09-30 MED ORDER — SODIUM CHLORIDE 0.9% IV SOLUTION: Freq: Once | INTRAVENOUS | Status: DC

## 2021-09-30 NOTE — Progress Notes (Signed)
Mobility Specialist - Progress Note ? ? 09/30/21 1100  ?Mobility  ?Activity Ambulated with assistance in hallway  ?Level of Assistance Standby assist, set-up cues, supervision of patient - no hands on  ?Assistive Device Front wheel walker  ?Distance Ambulated (ft) 200 ft  ?Activity Response Tolerated well  ?$Mobility charge 1 Mobility  ? ? ?Pt sitting in recliner upon arrival, utilizing RA. Pt ambulated in hallway with supervision. Did voice stiffness in BLE prior to activity, but reports relief once ambulating. Pt left in recliner with alarm set, needs in reach.  ? ? ?Yolanda [name] ?Mobility Specialist ?09/30/21, 12:00 PM ? ? ? ? ?

## 2021-09-30 NOTE — Progress Notes (Signed)
Occupational Therapy Treatment ?Patient Details ?Name: Yolanda [name] ?MRN: [id number] ?DOB: 04/02/1956 ?Today's Date: 09/30/2021 ? ? ?History of present illness Pt is a 66 y/o F admitted on 09/24/21 after presenting with c/o syncope & fatigue. Pt noted to have Hgb of 7.3 down from 14.1 in Feb 2023. Pt with hx of cognitive decline/dementia. Pt is being treated for acute symptomatic anemia. PMH: stroke with L sided hemiplegia, VP shunt, HTN, COPD, CKD stage 3A ?  ?OT comments ? Pt seen for OT tx session with pt endorsing need to go to the bathroom. Pt required MIN A to EOB, VC for hand placement to stand with RW, and CGA to get to the bathroom and perform toilet transfer with BSC over toilet. SUPV to wash hands in standing. Pt left in bed, all needs in reach, bed alarm on. Pt continues to benefit from skilled OT services.   ? ?Recommendations for follow up therapy are one component of a multi-disciplinary discharge planning process, led by the attending physician.  Recommendations may be updated based on patient status, additional functional criteria and insurance authorization. ?   ?Follow Up Recommendations ? Home health OT  ?  ?Assistance Recommended at Discharge Frequent or constant Supervision/Assistance  ?Patient can return home with the following ? A little help with walking and/or transfers;Assistance with cooking/housework;Help with stairs or ramp for entrance;Assist for transportation;Direct supervision/assist for medications management;Direct supervision/assist for financial management;A little help with bathing/dressing/bathroom ?  ?Equipment Recommendations ? BSC/3in1;Other (comment) (2WW)  ?  ?Recommendations for Other Services   ? ?  ?Precautions / Restrictions Precautions ?Precautions: Fall ?Restrictions ?Weight Bearing Restrictions: No  ? ? ?  ? ?Mobility Bed Mobility ?Overal bed mobility: Needs Assistance ?Bed Mobility: Supine to Sit, Sit to Supine ?  ?  ?Supine to sit: Min assist, HOB elevated ?Sit to  supine: Min guard ?  ?  ?  ? ?Transfers ?Overall transfer level: Needs assistance ?Equipment used: Rolling walker (2 wheels) ?Transfers: Sit to/from Stand ?Sit to Stand: From elevated surface, Min guard ?  ?  ?  ?  ?  ?  ?  ?  ?Balance Overall balance assessment: Needs assistance ?Sitting-balance support: No upper extremity supported, Feet supported ?Sitting balance-Leahy Scale: Good ?  ?  ?Standing balance support: During functional activity, No upper extremity supported, Single extremity supported ?Standing balance-Leahy Scale: Fair ?Standing balance comment: Pt able to stand & perform peri hygiene with supervision. ?  ?  ?  ?  ?  ?  ?  ?  ?  ?  ?  ?   ? ?ADL either performed or assessed with clinical judgement  ? ?ADL Overall ADL's : Needs assistance/impaired ?  ?  ?Grooming: Wash/dry hands;Standing;Supervision/safety ?  ?  ?  ?  ?  ?  ?  ?  ?  ?Toilet Transfer: Press photographer (2 wheels) ?Toilet Transfer Details (indicate cue type and reason): BSC over toilet ?Toileting- Clothing Manipulation and Hygiene: Sit to/from stand;Min guard ?  ?  ?  ?  ?  ?  ? ?Extremity/Trunk Assessment   ?  ?  ?  ?  ?  ? ?Vision   ?  ?  ?Perception   ?  ?Praxis   ?  ? ?Cognition Arousal/Alertness: Awake/alert ?Behavior During Therapy: [facility] for tasks assessed/performed ?Overall Cognitive Status: History of cognitive impairments - at baseline ?  ?  ?  ?  ?  ?  ?  ?  ?  ?  ?  ?  ?  ?  ?  ?  ?  General Comments: VC for safety, RW mgt, slightly impulsive ?  ?  ?   ?Exercises   ? ?  ?Shoulder Instructions   ? ? ?  ?General Comments    ? ? ?Pertinent Vitals/ Pain       Pain Assessment ?Pain Assessment: Faces ?Faces Pain Scale: Hurts a little bit ?Pain Location: BLE initially ?Pain Descriptors / Indicators: Grimacing, Guarding ?Pain Intervention(s): Limited activity within patient's tolerance, Monitored during session, Repositioned ? ?Home Living   ?  ?  ?  ?  ?  ?  ?  ?  ?  ?  ?  ?  ?  ?  ?  ?  ?  ?  ? ?  ?Prior  Functioning/Environment    ?  ?  ?  ?   ? ?Frequency ? Min 2X/week  ? ? ? ? ?  ?Progress Toward Goals ? ?OT Goals(current goals can now be found in the care plan section) ? Progress towards OT goals: Progressing toward goals ? ?Acute Rehab OT Goals ?Patient Stated Goal: go home ?OT Goal Formulation: With patient ?Time For Goal Achievement: 10/09/21 ?Potential to Achieve Goals: Good  ?Plan Discharge plan remains appropriate;Frequency remains appropriate   ? ?Co-evaluation ? ? ?   ?  ?  ?  ?  ? ?  ?AM-PAC OT "6 Clicks" Daily Activity     ?Outcome Measure ? ? Help from another person eating meals?: None ?  ?Help from another person toileting, which includes using toliet, bedpan, or urinal?: A Little ?Help from another person bathing (including washing, rinsing, drying)?: A Little ?Help from another person to put on and taking off regular upper body clothing?: A Little ?Help from another person to put on and taking off regular lower body clothing?: A Little ?6 Click Score: 16 ? ?  ?End of Session Equipment Utilized During Treatment: Gait belt;Rolling walker (2 wheels) ? ?OT Visit Diagnosis: Other abnormalities of gait and mobility (R26.89);Muscle weakness (generalized) (M62.81) ?  ?Activity Tolerance Patient tolerated treatment well ?  ?Patient Left in bed;with bed alarm set;with call bell/phone within reach ?  ?Nurse Communication  (nurse tech- BSC needs bucket) ?  ? ?   ? ?Time: [id number] ?OT Time Calculation (min): 19 min ? ?Charges: OT General Charges ?$OT Visit: 1 Visit ?OT Treatments ?$Self Care/Home Management : 8-22 mins ? ?Ardeth Perfect., MPH, MS, OTR/L ?ascom [phone] ?09/30/21, 4:52 PM ?

## 2021-09-30 NOTE — Progress Notes (Signed)
Yolanda [name]   DOB:[birth year]   KV#:[id number]   ? ?Subjective: pt resting in bed.  Denies any acute distress.  No bleeding or pain after bone marrow biopsy.  Denies any blood loss.  ? ?Objective:  ?Vitals:  ? 09/30/21 0435 09/30/21 0811  ?BP: 105/77 131/72  ?Pulse: 63 63  ?Resp: 16 18  ?Temp: 98.6 ?F (37 ?C) 97.7 ?F (36.5 ?C)  ?SpO2: 98% 99%  ?  ? ?Intake/Output Summary (Last 24 hours) at 09/30/2021 1342 ?Last data filed at 09/29/2021 1847 ?Gross per 24 hour  ?Intake 480 ml  ?Output --  ?Net 480 ml  ? ? ?Physical Exam ?Vitals and nursing note reviewed.  ?HENT:  ?   Head: Normocephalic and atraumatic.  ?   Mouth/Throat:  ?   Pharynx: Oropharynx is clear.  ?Eyes:  ?   Extraocular Movements: Extraocular movements intact.  ?   Pupils: Pupils are equal, round, and reactive to light.  ?Cardiovascular:  ?   Rate and Rhythm: Normal rate and regular rhythm.  ?Pulmonary:  ?   Comments: Decreased breath sounds bilaterally.  ?Abdominal:  ?   Palpations: Abdomen is soft.  ?Musculoskeletal:     ?   General: Normal range of motion.  ?   Cervical back: Normal range of motion.  ?Skin: ?   General: Skin is warm.  ?Neurological:  ?   General: No focal deficit present.  ?   Mental Status: She is alert and oriented to person, place, and time.  ?Psychiatric:     ?   Behavior: Behavior normal.     ?   Judgment: Judgment normal.  ? ?  ?Labs:  ?Lab Results  ?Component Value Date  ? WBC 3.9 (L) 09/30/2021  ? HGB 7.9 (L) 09/30/2021  ? HCT 25.7 (L) 09/30/2021  ? MCV 88.9 09/30/2021  ? PLT 240 09/30/2021  ? NEUTROABS 2.4 09/30/2021  ? ? ?Lab Results  ?Component Value Date  ? NA 134 (L) 09/30/2021  ? K 3.5 09/30/2021  ? CL 103 09/30/2021  ? CO2 23 09/30/2021  ? ? ?Studies:  ?IR BONE MARROW BIOPSY & ASPIRATION ? ?Result Date: 09/29/2021 ?INDICATION: Anemia EXAM: IR BONE MARRO BIOPY AND ASPIRATION MEDICATIONS: None. ANESTHESIA/SEDATION: Moderate (conscious) sedation was employed during this procedure. A total of Versed 2 mg and Fentanyl 100 mcg was  administered intravenously. Moderate Sedation Time: 13 minutes. The patient's level of consciousness and vital signs were monitored continuously by radiology nursing throughout the procedure under my direct supervision. FLUOROSCOPY TIME:  Fluoroscopy Time: 2.1 minutes (18 mGy) COMPLICATIONS: None immediate. PROCEDURE: Informed written consent was obtained from the patient after a thorough discussion of the procedural risks, benefits and alternatives. All questions were addressed. Maximal Sterile Barrier Technique was utilized including caps, mask, sterile gowns, sterile gloves, sterile drape, hand hygiene and skin antiseptic. A timeout was performed prior to the initiation of the procedure. The patient was placed prone on the IR exam table. Fluoroscopy was used for planning purposes and to identify the right posterior ilium. Skin entry site was marked, and the overlying skin was prepped and draped in the standard sterile fashion. Local analgesia was obtained with 1% lidocaine. Using fluoroscopic guidance, an 11 gauge needle was advanced just deep to the cortex of the right posterior ilium. Subsequently, bone marrow aspiration and core biopsy were performed. Specimens were submitted to lab/pathology for handling. Hemostasis was achieved with manual pressure, and a clean dressing was placed. The patient tolerated the procedure well without immediate  complication. IMPRESSION: Successful bone marrow aspiration and core biopsy of the right posterior ilium using fluoroscopic guidance. Electronically Signed   By: Albin Felling M.D.   On: 09/29/2021 12:59   ? ?# 66 year old female patient with a history of multiple strokes; dementia currently admitted to hospital for worsening fatigue.  Hemoglobin around 7.  ?  ?#Acute severe anemia-hemoglobin around 7; February 2023-hemoglobin was normal at 14 [[facility]].  No hemolysis noted; no blood loss noted. [EGD/[place]-Dr.wohl;? capsule].  Suspect bone marrow suppression from viral  infection/medication.  Await bone marrow biopsy results.  Defer to primary service regarding monitor PRBC blood transfusion-to medically optimize. ?  ?#History of multiple strokes-with memory deficits. ?  ?#Chronic numbness in the legs-progressive as per patient/family-question B12 def; s/p supplementation.  ? ?#Reviewed the above plan of care with the patient's 2 daughters [over the iPad]; and also discussed with GI/primary service.  ? ? ?Cammie Sickle, MD ?09/30/2021  1:42 PM ? ? ?

## 2021-09-30 NOTE — Progress Notes (Signed)
? ? ? ?Progress Note  ? ? ?Yolanda [name]  LAG:[id number] DOB: Jan 15, 1956  DOA: 09/24/2021 ?PCP: Lorna Few, DO  ? ? ? ? ?Brief Narrative:  ? ? ?Medical records reviewed and are as summarized below: ? ?Yolanda [name] is a 66 y.o. female with medical history of stroke with left-sided hemiplegia, history of VP shunt, cognitive decline, hypertension, COPD, CKD stage IIIa, who lives at home alone.  She presented to the hospital because of fatigue.  Hemoglobin was 14.1 on 08/31/2021. ? ?On admission, she was found to have a hemoglobin of 7.3.  She had EGD and colonoscopy which did not reveal any source of bleeding.  Hematologist was consulted and patient underwent bone marrow biopsy for further evaluation. ? ? ? ?Assessment/Plan:  ? ?Principal Problem: ?  Symptomatic anemia ?Active Problems: ?  AKI (acute kidney injury) ([place]) ?  Hemiplegia as late effect of cerebrovascular accident (CVA) ([place]) - left side hemiplegia ?  Essential hypertension ?  COPD (chronic obstructive pulmonary disease) ([place]) ?  Stage 3a chronic kidney disease (CKD) ([facility]) - baseline SCr 1.6 ?  Dementia ([place]) ?  Polyp of transverse colon ?  Polyp of duodenum ? ? ?Nutrition Problem: Inadequate oral intake ?Etiology: acute illness ? ?Signs/Symptoms: meal completion < 50% ? ? ?Body mass index is 27.62 kg/m?. ? ? ?Acute symptomatic anemia, vitamin B12 deficiency, mild iron deficiency: S/p bone marrow biopsy on 09/29/2021.  Biopsy report is pending.  S/p EGD and colonoscopy on 09/27/2021 which did not show any source of bleeding.  Continue Protonix.  Dr. Rogue Bussing, hematologist, recommended additional units of packed red blood cells for anemia.  Continue vitamin B12 supplement. ? ?AKI on CKD stage IIIa: Baseline creatinine around 1.6.  Creatinine is stable.  Monitor BMP. ? ?Cognitive impairment/? dementia, history of stroke, history of VP shunt: Aspirin on hold.  Continue Aricept. ? ?Other comorbidities include hypertension, COPD ? ?Case discussed with Dr.  Allen Norris, gastroenterologist.  Her daughters were under the impression that patient was going to have video capsule endoscopy prior to discharge. Dr. Allen Norris called her daughters today to clarify that capsule endoscopy will only be considered if bone marrow study is unrevealing.  Daughters are agreeable with this plan. ?I have discussed plan for blood transfusion with her daughters.  Patient will likely be discharged home tomorrow if H&H remained stable. Yolanda [name] is in agreement with the plan. ? ? ?Diet Order   ? ?       ?  Diet regular Room service appropriate? Yes; Fluid consistency: Thin  Diet effective now       ?  ? ?  ?  ? ?  ? ? ? ? ? ? ?Consultants: ?Gastroenterologist ?Hematologist ?Interventional radiologist ? ?Procedures: ?Bone marrow biopsy on 09/29/2021 ? ? ? ?Medications:  ? ? sodium chloride   Intravenous Once  ? amLODipine  10 mg Oral Daily  ? feeding supplement  1 Container Oral TID BM  ? ferrous sulfate  325 mg Oral Q breakfast  ? multivitamin with minerals  1 tablet Oral Daily  ? pantoprazole  40 mg Oral BID  ? vitamin B-12  1,000 mcg Oral Daily  ? ?Continuous Infusions: ? sodium chloride Stopped (09/29/21 1038)  ? ? ? ?Anti-infectives (From admission, onward)  ? ? None  ? ?  ? ? ? ? ? ? ? ? ? ?Family Communication/Anticipated D/C date and plan/Code Status  ? ?DVT prophylaxis: SCDs Start: 09/24/21 2147 ? ? ?  Code Status: Full Code ? ?  Family Communication: Plan discussed with Yolanda [name], daughters, over the phone ?Disposition Plan: Possible discharge to home tomorrow ? ? ?Status is: Inpatient ?Remains inpatient appropriate because: Generalized weakness ? ? ? ? ? ? ?Subjective:  ? ?Interval events noted.  She feels better today.  She wants to ambulate more.  She said she has a treadmill at home and she cannot wait to get back on it.  No vomiting, abdominal pain, rectal bleeding, shortness of breath or chest pain. ? ?Objective:  ? ? ?Vitals:  ? 09/29/21 1542 09/29/21 1949 09/30/21 0435 09/30/21 0811  ?BP:  114/85 (!) 108/59 105/77 131/72  ?Pulse: 66 68 63 63  ?Resp:  _0 ?Temp: 98.2 ?F (36.8 ?C) 98.7 ?F (37.1 ?C) 98.6 ?F (37 ?C) 97.7 ?F (36.5 ?C)  ?TempSrc: Oral Oral  Oral  ?SpO2: 100% 99% 98% 99%  ?Weight:      ?Height:      ? ?No data found. ? ? ?Intake/Output Summary (Last 24 hours) at 09/30/2021 1148 ?Last data filed at 09/29/2021 1847 ?Gross per 24 hour  ?Intake 480 ml  ?Output --  ?Net 480 ml  ? ?Filed Weights  ? 09/26/21 1758 09/27/21 1058  ?Weight: 80 kg 80 kg  ? ? ?Exam: ? ?GEN: NAD ?SKIN: No rash ?EYES: EOMI ?ENT: MMM ?CV: RRR ?PULM: CTA B ?ABD: soft, ND, NT, +BS ?CNS: AAO x 3, non focal ?EXT: No edema or tenderness ? ? ? ?  ? ? ?Data Reviewed:  ? ?I have personally reviewed following labs and imaging studies: ? ?Labs: ?Labs show the following:  ? ?Basic Metabolic Panel: ?Recent Labs  ?Lab 09/24/21 ?1326 09/25/21 ?0432 09/26/21 ?6546 09/28/21 ?1358 09/30/21 ?0428  ?NA 135 132* 134* 136 134*  ?K 4.5 4.2 4.0 3.8 3.5  ?CL 107 107 105 105 103  ?CO2 19* 19* 22 20* 23  ?GLUCOSE 97 100* 92 83 98  ?BUN 24* 25* _1 ?CREATININE 2.30* 2.04* 2.04* 1.83* 1.83*  ?CALCIUM 8.8* 8.0* 8.2* 8.2* 8.1*  ?MG  --  2.1 1.9  --   --   ? ?GFR ?Estimated Creatinine Clearance: 32 mL/min (A) (by C-G formula based on SCr of 1.83 mg/dL (H)). ?Liver Function Tests: ?Recent Labs  ?Lab 09/25/21 ?0432 09/26/21 ?5035  ?AST 7* 11*  ?ALT 7 7  ?ALKPHOS 44 47  ?BILITOT 1.7* 1.4*  ?PROT 5.8* 6.2*  ?ALBUMIN 2.7* 2.9*  ? ?No results for input(s): LIPASE, AMYLASE in the last 168 hours. ?No results for input(s): AMMONIA in the last 168 hours. ?Coagulation profile ?Recent Labs  ?Lab 09/24/21 ?1326 09/25/21 ?1041  ?INR 1.0 1.1  ? ? ?CBC: ?Recent Labs  ?Lab 09/24/21 ?1326 09/24/21 ?2331 09/25/21 ?0432 09/25/21 ?1241 09/28/21 ?0423 09/28/21 ?1957 09/29/21 ?0450 09/29/21 ?1640 09/30/21 ?0428  ?WBC 9.0 6.8 5.7  --   --   --  4.1  --  3.9*  ?NEUTROABS  --   --  3.8  --   --   --  2.6  --  2.4  ?HGB 7.3* 8.5* 7.7*   < > 7.2* 8.2* 7.7* 7.9* 7.9*  ?HCT  24.0* 26.6* 24.4*  --   --   --  23.9*  --  25.7*  ?MCV 85.4 83.9 84.1  --   --   --  87.2  --  88.9  ?PLT 357 313 260  --   --   --  240  --  240  ? < > = values  in this interval not displayed.  ? ?Cardiac Enzymes: ?No results for input(s): CKTOTAL, CKMB, CKMBINDEX, TROPONINI in the last 168 hours. ?BNP (last 3 results) ?No results for input(s): PROBNP in the last 8760 hours. ?CBG: ?No results for input(s): GLUCAP in the last 168 hours. ?D-Dimer: ?No results for input(s): DDIMER in the last 72 hours. ?Hgb A1c: ?No results for input(s): HGBA1C in the last 72 hours. ?Lipid Profile: ?No results for input(s): CHOL, HDL, LDLCALC, TRIG, CHOLHDL, LDLDIRECT in the last 72 hours. ?Thyroid function studies: ?No results for input(s): TSH, T4TOTAL, T3FREE, THYROIDAB in the last 72 hours. ? ?Invalid input(s): FREET3 ?Anemia work up: ?Recent Labs  ?  09/28/21 ?1358  ?RETICCTPCT 6.9*  ? ?Sepsis Labs: ?Recent Labs  ?Lab 09/24/21 ?9470 09/25/21 ?7615 09/29/21 ?0450 09/30/21 ?0428  ?WBC 6.8 5.7 4.1 3.9*  ? ? ?Microbiology ?Recent Results (from the past 240 hour(s))  ?Resp Panel by RT-PCR (Flu A&B, Covid) Nasopharyngeal Swab     Status: None  ? Collection Time: 09/25/21 12:00 PM  ? Specimen: Nasopharyngeal Swab; Nasopharyngeal(NP) swabs in vial transport medium  ?Result Value Ref Range Status  ? SARS Coronavirus 2 by RT PCR NEGATIVE NEGATIVE Final  ?  Comment: (NOTE) ?SARS-CoV-2 target nucleic acids are NOT DETECTED. ? ?The SARS-CoV-2 RNA is generally detectable in upper respiratory ?specimens during the acute phase of infection. The lowest ?concentration of SARS-CoV-2 viral copies this assay can detect is ?138 copies/mL. A negative result does not preclude SARS-Cov-2 ?infection and should not be used as the sole basis for treatment or ?other patient management decisions. A negative result may occur with  ?improper specimen collection/handling, submission of specimen other ?than nasopharyngeal swab, presence of viral mutation(s)  within the ?areas targeted by this assay, and inadequate number of viral ?copies(<138 copies/mL). A negative result must be combined with ?clinical observations, patient history, and epidemiological ?inf

## 2021-09-30 NOTE — Progress Notes (Signed)
Nutrition Follow-up ? ?DOCUMENTATION CODES:  ? ?Not applicable ? ?INTERVENTION:  ? ?Boost Breeze po TID, each supplement provides 250 kcal and 9 grams of protein ? ?MVI po daily  ? ?NUTRITION DIAGNOSIS:  ? ?Inadequate oral intake related to acute illness as evidenced by meal completion < 50%. ? ?GOAL:  ? ?Patient will meet greater than or equal to 90% of their needs ?-progressing  ? ?MONITOR:  ? ?PO intake, Supplement acceptance, Labs, Weight trends, Skin, I & O's ? ?ASSESSMENT:  ? ?Pt admitted from home with syncope and fatigue secondary to symptomatic anemia. PMH includes stroke with L sided hemiplegia, h/o VP shunt, HTN, COPD, CKD stage IIIa and dementia. ? ?Pt s/p EGD/colonoscopy 3/13 with [facility] ?Pt s/p bone marrow aspirate 3/15; results pending  ? ?Met with pt in room today. Pt reports that her appetite and oral intake has been pretty good in hospital but reports that she has been having some intermittent vomiting. Pt reports that she is unable to drink Ensure as this causes her to have diarrhea. Pt reports that she is lactose intolerant. Pt has been afraid to try the [facility] as she fears having diarrhea. RD encouraged pt to try the [facility]. Discussed with pt the importance of adequate nutrition needed to preserve lean muscle. Pt reports that she is willing to try the Boost Breeze. Pt is documented to be eating 100% of meals in hospital. Per chart, pt is down 5lbs since admit. Pt with B12 deficiency; this is being supplemented.  ? ?Medications reviewed and include: ferrous sulfate, MVI, protonix, B12 ?B12- 115(L) ?Labs reviewed: Na 134(L), K 3.5 wnl, creat 1.83(H), Mg 1.9 wnl ?Wbc- 3.9(L), Hgb 7.9(L), Hct 25.7(L) ? ?NUTRITION - FOCUSED PHYSICAL EXAM: ? ?Flowsheet Row Most Recent Value  ?Upper Arm Region No depletion  ?Thoracic and Lumbar Region No depletion  ?Buccal Region No depletion  ?Temple Region Mild depletion  ?Clavicle Bone Region No depletion  ?Clavicle and Acromion Bone Region No depletion   ?Scapular Bone Region No depletion  ?Dorsal Hand Mild depletion  ?Patellar Region No depletion  ?Anterior Thigh Region No depletion  ?Posterior Calf Region No depletion  ?Edema (RD Assessment) Mild  ?Hair Reviewed  ?Eyes Reviewed  ?Mouth Reviewed  ?Skin Reviewed  ?Nails Reviewed  ? ?Diet Order:   ?Diet Order   ? ?       ?  Diet regular Room service appropriate? Yes; Fluid consistency: Thin  Diet effective now       ?  ? ?  ?  ? ?  ? ?EDUCATION NEEDS:  ? ?No education needs have been identified at this time ? ?Skin:  Skin Assessment: Reviewed RN Assessment ? ?Last BM:  3/13- type 7 ? ?Height:  ? ?Ht Readings from Last 1 Encounters:  ?09/27/21 $RemoveBe'5\' 7"'SuTdjHSws$  (1.702 m)  ? ? ?Weight:  ? ?Wt Readings from Last 1 Encounters:  ?09/27/21 80 kg  ? ? ?BMI:  Body mass index is 27.62 kg/m?. ? ?Estimated Nutritional Needs:  ? ?Kcal:  1700-1900kcal/day ? ?Protein:  85-85g/day ? ?Fluid:  1.7-2.0L/day ? ?Koleen Distance MS, RD, LDN ?Please refer to AMION for RD and/or RD on-call/weekend/after hours pager ? ?

## 2021-10-01 ENCOUNTER — Telehealth: Payer: Self-pay | Admitting: Internal Medicine

## 2021-10-01 DIAGNOSIS — D649 Anemia, unspecified: Secondary | ICD-10-CM | POA: Diagnosis not present

## 2021-10-01 DIAGNOSIS — N179 Acute kidney failure, unspecified: Secondary | ICD-10-CM | POA: Diagnosis not present

## 2021-10-01 LAB — TYPE AND SCREEN
ABO/RH(D): A POS
Antibody Screen: NEGATIVE
Unit division: 0

## 2021-10-01 LAB — BPAM RBC
Blood Product Expiration Date: 202304142359
ISSUE DATE / TIME: 202303161455
Unit Type and Rh: 6200

## 2021-10-01 LAB — CBC WITH DIFFERENTIAL/PLATELET
Abs Immature Granulocytes: 0.02 10*3/uL (ref 0.00–0.07)
Basophils Absolute: 0 10*3/uL (ref 0.0–0.1)
Basophils Relative: 0 %
Eosinophils Absolute: 0.1 10*3/uL (ref 0.0–0.5)
Eosinophils Relative: 2 %
HCT: 30.9 % — ABNORMAL LOW (ref 36.0–46.0)
Hemoglobin: 9.6 g/dL — ABNORMAL LOW (ref 12.0–15.0)
Immature Granulocytes: 0 %
Lymphocytes Relative: 23 %
Lymphs Abs: 1.1 10*3/uL (ref 0.7–4.0)
MCH: 27.1 pg (ref 26.0–34.0)
MCHC: 31.1 g/dL (ref 30.0–36.0)
MCV: 87.3 fL (ref 80.0–100.0)
Monocytes Absolute: 0.5 10*3/uL (ref 0.1–1.0)
Monocytes Relative: 10 %
Neutro Abs: 3 10*3/uL (ref 1.7–7.7)
Neutrophils Relative %: 65 %
Platelets: 244 10*3/uL (ref 150–400)
RBC: 3.54 MIL/uL — ABNORMAL LOW (ref 3.87–5.11)
RDW: 20.1 % — ABNORMAL HIGH (ref 11.5–15.5)
WBC: 4.7 10*3/uL (ref 4.0–10.5)
nRBC: 0 % (ref 0.0–0.2)

## 2021-10-01 LAB — BASIC METABOLIC PANEL
Anion gap: 10 (ref 5–15)
BUN: 12 mg/dL (ref 8–23)
CO2: 24 mmol/L (ref 22–32)
Calcium: 8.2 mg/dL — ABNORMAL LOW (ref 8.9–10.3)
Chloride: 102 mmol/L (ref 98–111)
Creatinine, Ser: 1.55 mg/dL — ABNORMAL HIGH (ref 0.44–1.00)
GFR, Estimated: 36 mL/min — ABNORMAL LOW (ref >60)
Glucose, Bld: 95 mg/dL (ref 70–99)
Potassium: 3 mmol/L — ABNORMAL LOW (ref 3.5–5.1)
Sodium: 136 mmol/L (ref 135–145)

## 2021-10-01 LAB — SURGICAL PATHOLOGY

## 2021-10-01 MED ORDER — CYANOCOBALAMIN 1000 MCG PO TABS: 1000.0000 ug | ORAL_TABLET | Freq: Every day | ORAL | Status: AC

## 2021-10-01 NOTE — TOC Transition Note (Addendum)
Transition of Care (TOC) - CM/SW Discharge Note ? ? ?Patient Details  ?Name: ZYERA LICHTMAN ?MRN: [id number] ?Date of Birth: [birth year] ? ?Transition of Care (TOC) CM/SW Contact:  ?Karolynn Infantino E Mateja Dier, LCSW ?Phone Number: ?10/01/2021, 11:40 AM ? ? ?Clinical Narrative:   Patient is being DC home today. ?CSW faxed home health orders to [facility]. ?Call from Randall Hiss with [place] who confirmed he received the fax.  ? ?4:22- DC Summary faxed to Randall Hiss with [facility] per his request.  ? ?Final next level of care: [place] ?Barriers to Discharge: Barriers Resolved ? ? ?Patient Goals and CMS Choice ?  ?  ?  ? ?Discharge Placement ?  ?           ?  ?  ?  ?  ? ?Discharge Plan and Services ?  ?  ?           ?  ?  ?  ?  ?  ?  ?[place] Agency: Other - See comment ([place]) ?Date HH Agency Contacted: 10/01/21 ?  ?Representative spoke with at [place]: faxed orders ? ?Social Determinants of Health (SDOH) Interventions ?  ? ? ?Readmission Risk Interventions ?No flowsheet data found. ? ? ? ? ?

## 2021-10-01 NOTE — Telephone Encounter (Signed)
Yolanda [name] was seen in the hospital/[name] for acute anemia-clinically suspected bone marrow suppression from possible viral infection.  Bone marrow biopsy pending/will have results by the time you might get to see the [name].  ? ?Just FYI-family is nice; but can be demanding. Please make sure you get to talk to daughters during the appt.  ? ?Thanks ?GB ? ? ? ? ?

## 2021-10-01 NOTE — Progress Notes (Signed)
Patient hemoglobin stable post PRBC transfusion around 9.  Pending bone marrow biopsy results.  Patient follow-up in the clinic in the week of 3/27.. ? ?

## 2021-10-01 NOTE — Care Management Important Message (Signed)
Important Message ? ?Patient Details  ?Name: Yolanda [name] ?MRN: [id number] ?Date of Birth: 31-Aug-1955 ? ? ?Medicare Important Message Given:  Yes ? ? ? ? ?Dannette Barbara ?10/01/2021, 10:48 AM ?

## 2021-10-01 NOTE — Progress Notes (Signed)
Pt discharged per MD order. IV removed. Discharge instructions reviewed with pt and her daughter. All questions answered to pt and daughters satisfaction. Pt taken to car in wheelchair via staff.  ?

## 2021-10-01 NOTE — Plan of Care (Signed)

## 2021-10-01 NOTE — Discharge Summary (Signed)
? ?Physician Discharge Summary  ?ELARIA OSIAS BBC:[id number] DOB: 07/17/1956 DOA: 09/24/2021 ? ?PCP: Lorna Few, DO ? ?Admit date: 09/24/2021 ?Discharge date: 10/01/2021 ? ?Discharge disposition: Home ? ? ?Recommendations for Outpatient Follow-Up:  ? ?Follow-up with Dr. Rogue Bussing, hematologist, in 1 week ?Follow-up with PCP in 1 week ? ? ?Discharge Diagnosis:  ? ?Principal Problem: ?  Symptomatic anemia ?Active Problems: ?  AKI (acute kidney injury) ([place]) ?  Hemiplegia as late effect of cerebrovascular accident (CVA) ([place]) - left side hemiplegia ?  Essential hypertension ?  COPD (chronic obstructive pulmonary disease) ([place]) ?  Stage 3a chronic kidney disease (CKD) ([facility]) - baseline SCr 1.6 ?  Dementia ([place]) ?  Polyp of transverse colon ?  Polyp of duodenum ? ? ? ?Discharge Condition: Stable. ? ?Diet recommendation:  ?Diet Order   ? ?       ?  Diet - low sodium heart healthy       ?  ?  Diet regular Room service appropriate? Yes; Fluid consistency: Thin  Diet effective now       ?  ? ?  ?  ? ?  ? ? ?  Code Status: Full Code ? ? ? ? ?Hospital Course:  ? ?Ms. Yolanda [name] is a 66 y.o. female with medical history of stroke with left-sided weakness, history of VP shunt, cognitive decline, hypertension, COPD, CKD stage IIIa, who lives at home alone.  She presented to the hospital because of poor oral intake, fatigue and syncope.  Hemoglobin was 14.1 on 08/31/2021. ?  ?On admission, she was found to have a hemoglobin of 7.3 and AKI.   ?She was transfused with 2 units of packed red blood cells.  H&H has improved.  Creatinine has improved to baseline. ? ?She had EGD and colonoscopy which did not reveal any source of bleeding.  Hematologist was consulted and patient underwent bone marrow biopsy for further evaluation.  Bone marrow biopsy result is pending at the time of discharge.  She was evaluated by PT and OT and home health therapy was recommended.  Her condition has improved and she is deemed stable for discharge to  home today.  Discharge plan was discussed with Yolanda [name], daughter, over the phone and she agrees with discharge plan. ?  ? ? ? ?Medical Consultants:  ? ?Gastroenterology ?Oncology ? ? ?Discharge Exam:  ? ? ?Vitals:  ? 09/30/21 1530 09/30/21 1804 09/30/21 2013 10/01/21 0430  ?BP:  (!) 141/85 (!) 141/92 134/85  ?Pulse:  67 75 72  ?Resp:  _0 ?Temp:  98.2 ?F (36.8 ?C) 98.3 ?F (36.8 ?C) 98.1 ?F (36.7 ?C)  ?TempSrc:      ?SpO2:  100% 100% 100%  ?Weight: 78.9 kg     ?Height:      ? ? ? ?GEN: NAD ?SKIN: No rash ?EYES: EOMI ?ENT: MMM ?CV: RRR ?PULM: CTA B ?ABD: soft, ND, NT, +BS ?CNS: AAO x 3, non focal ?EXT: No edema or tenderness ? ? ? ?The results of significant diagnostics from this hospitalization (including imaging, microbiology, ancillary and laboratory) are listed below for reference.   ? ? ?Procedures and Diagnostic Studies:  ? ?CT ABDOMEN PELVIS WO CONTRAST ? ?Result Date: 09/25/2021 ?CLINICAL DATA:  Acute abdominal pain.  Weakness. EXAM: CT ABDOMEN AND PELVIS WITHOUT CONTRAST TECHNIQUE: Multidetector CT imaging of the abdomen and pelvis was performed following the standard protocol without IV contrast. RADIATION DOSE REDUCTION: This exam was performed according to the departmental dose-optimization program  which includes automated exposure control, adjustment of the mA and/or kV according to patient size and/or use of iterative reconstruction technique. COMPARISON:  Renal ultrasound 02/18/2020 and report from CT abdomen from 01/31/2002 FINDINGS: Lower chest: Mild dependent atelectasis in both lower lobes. Descending thoracic aortic and coronary artery atherosclerotic calcification. Mild cardiomegaly. Hepatobiliary: Cholecystectomy. Distal CBD proximally 0.7 cm in diameter on image 28 series 2. Pancreas: Unremarkable Spleen: Unremarkable Adrenals/Urinary Tract: Both adrenal glands appear normal. 2.8 cm left kidney upper pole fluid density lesion compatible with cyst as shown on prior ultrasound. 0.8 cm hypodense  lesion in the right kidney upper pole as shown on prior ultrasound. No urinary tract calculi observed. Urinary bladder unremarkable. Stomach/Bowel: Fatty deposition in the wall of the ascending colon, this is frequently incidental although does have a weak association with inflammatory bowel disease. Terminal ileum currently appears unremarkable. Appendix unremarkable. Focal hazy opacity in a 1.9 by 1.2 cm lobulation of adipose tissue anterior to the descending colon on image 53 series 12, possibly from appendagitis epiploica or focal fat necrosis. Vascular/Lymphatic: Atherosclerosis is present, including aortoiliac atherosclerotic disease. No pathologic adenopathy identified. Reproductive: Vascular calcifications along the margins of the uterus. Other: Peritoneal shunt tubing terminates in the left lower quadrant. Musculoskeletal: Mild prominence of epidural adipose tissues in the lower lumbar spine. Mild lower lumbar spondylosis and degenerative disc disease. IMPRESSION: 1. Focal nodular stranding in adipose tissue anterior to the distal descending colon on image 53 of series 2 potentially from appendagitis epiploica or focal fat necrosis. 2. Other imaging findings of potential clinical significance: Mild dependent atelectasis in both lower lobes. Aortic Atherosclerosis (ICD10-I70.0). Coronary atherosclerosis. Mild cardiomegaly. Bilateral renal cysts. Mild lower lumbar spondylosis and degenerative disc disease. Electronically Signed   By: Van Clines M.D.   On: 09/25/2021 11:08  ? ?CT HEAD WO CONTRAST (5MM) ? ?Result Date: 09/24/2021 ?CLINICAL DATA:  Syncope. EXAM: CT HEAD WITHOUT CONTRAST TECHNIQUE: Contiguous axial images were obtained from the base of the skull through the vertex without intravenous contrast. RADIATION DOSE REDUCTION: This exam was performed according to the departmental dose-optimization program which includes automated exposure control, adjustment of the mA and/or kV according to  patient size and/or use of iterative reconstruction technique. COMPARISON:  September 20, 2014. FINDINGS: Brain: Right frontal ventriculostomy is noted with distal tip passing through left frontal horn and tip in left basal ganglia. No ventricular dilatation is noted. Aneurysm clip is noted in left basal ganglia region. No mass effect or midline shift is noted. No acute infarction, hemorrhage or mass lesion is noted. Vascular: No hyperdense vessel or unexpected calcification. Skull: Status post right temporal craniotomy. Right frontal ventriculostomy is noted. Sinuses/Orbits: No acute finding. Other: None. IMPRESSION: Right frontal ventriculostomy catheter as described above. No ventricular dilatation is noted. No acute intracranial abnormality seen. Electronically Signed   By: Marijo Conception M.D.   On: 09/24/2021 16:17  ? ? ? ?Labs:  ? ?Basic Metabolic Panel: ?Recent Labs  ?Lab 09/25/21 ?0432 09/26/21 ?1779 09/28/21 ?1358 09/30/21 ?0428 10/01/21 ?0414  ?NA 132* 134* 136 134* 136  ?K 4.2 4.0 3.8 3.5 3.0*  ?CL 107 105 105 103 102  ?CO2 19* 22 20* 23 24  ?GLUCOSE 100* 92 83 98 95  ?BUN 25* _0 ?CREATININE 2.04* 2.04* 1.83* 1.83* 1.55*  ?CALCIUM 8.0* 8.2* 8.2* 8.1* 8.2*  ?MG 2.1 1.9  --   --   --   ? ?GFR ?Estimated Creatinine Clearance: 37.6 mL/min (A) (by C-G formula based  on SCr of 1.55 mg/dL (H)). ?Liver Function Tests: ?Recent Labs  ?Lab 09/25/21 ?0432 09/26/21 ?3536  ?AST 7* 11*  ?ALT 7 7  ?ALKPHOS 44 47  ?BILITOT 1.7* 1.4*  ?PROT 5.8* 6.2*  ?ALBUMIN 2.7* 2.9*  ? ?No results for input(s): LIPASE, AMYLASE in the last 168 hours. ?No results for input(s): AMMONIA in the last 168 hours. ?Coagulation profile ?Recent Labs  ?Lab 09/24/21 ?1326 09/25/21 ?1041  ?INR 1.0 1.1  ? ? ?CBC: ?Recent Labs  ?Lab 09/24/21 ?2331 09/25/21 ?0432 09/25/21 ?1241 09/29/21 ?0450 09/29/21 ?1640 09/30/21 ?0428 09/30/21 ?1924 10/01/21 ?0414  ?WBC 6.8 5.7  --  4.1  --  3.9*  --  4.7  ?NEUTROABS  --  3.8  --  2.6  --  2.4  --  3.0  ?HGB  8.5* 7.7*   < > 7.7* 7.9* 7.9* 9.3* 9.6*  ?HCT 26.6* 24.4*  --  23.9*  --  25.7*  --  30.9*  ?MCV 83.9 84.1  --  87.2  --  88.9  --  87.3  ?PLT 313 260  --  240  --  240  --  244  ? < > = values in this inte

## 2021-10-01 NOTE — Progress Notes (Signed)
Physical Therapy Treatment ?Patient Details ?Name: Yolanda [name] ?MRN: [id number] ?DOB: 13-Aug-1955 ?Today's Date: 10/01/2021 ? ? ?History of Present Illness Pt is a 66 y/o F admitted on 09/24/21 after presenting with c/o syncope & fatigue. Pt noted to have Hgb of 7.3 down from 14.1 in Feb 2023. Pt with hx of cognitive decline/dementia. Pt is being treated for acute symptomatic anemia. PMH: stroke with L sided hemiplegia, VP shunt, HTN, COPD, CKD stage 3A ? ?  ?PT Comments  ? ? Pt seen for PT tx with pt pleasantly confused throughout session, but able to follow commands with extra time. Pt is able to complete bed mobility with supervision with [facility] elevated & bed rails with extra time. Pt is able to complete sit>stand with supervision & ambulate in hallway with RW & supervision with frequent, ongoing cuing to ambulate within base of AD. Pt negotiates 4 steps with B rails with CGA & step to pattern as pt reports she has B rails to hold to at home. Pt is making good progress with PT so d/c recommendations have been updated to HHPT f/u.  ?   ?Recommendations for follow up therapy are one component of a multi-disciplinary discharge planning process, led by the attending physician.  Recommendations may be updated based on patient status, additional functional criteria and insurance authorization. ? ?Follow Up Recommendations ? Home health PT ?  ?  ?Assistance Recommended at Discharge Frequent or constant Supervision/Assistance  ?Patient can return home with the following A little help with walking and/or transfers;A lot of help with bathing/dressing/bathroom;Assistance with cooking/housework;Direct supervision/assist for financial management;Assist for transportation;Direct supervision/assist for medications management;Help with stairs or ramp for entrance ?  ?Equipment Recommendations ? Rolling walker (2 wheels)  ?  ?Recommendations for Other Services   ? ? ?  ?Precautions / Restrictions Precautions ?Precautions:  Fall ?Restrictions ?Weight Bearing Restrictions: No  ?  ? ?Mobility ? Bed Mobility ?Overal bed mobility: Needs Assistance ?Bed Mobility: Supine to Sit, Sit to Supine ?  ?  ?Supine to sit: Supervision, HOB elevated ?Sit to supine: Supervision, HOB elevated ?  ?  ?  ? ?Transfers ?Overall transfer level: Needs assistance ?Equipment used: Rolling walker (2 wheels) ?Transfers: Sit to/from Stand ?Sit to Stand: Supervision, From elevated surface ?  ?  ?  ?  ?  ?General transfer comment: Cuing for safe hand placement. ?  ? ?Ambulation/Gait ?Ambulation/Gait assistance: Supervision ?Gait Distance (Feet): 200 Feet ?Assistive device: Rolling walker (2 wheels) ?Gait Pattern/deviations: Decreased step length - right, Decreased step length - left, Decreased stride length ?  ?  ?  ?General Gait Details: Ongoing cuing to ambulate within base of AD as pt with poor carryover of information. ? ? ?Stairs ?Stairs: Yes ?Stairs assistance: Min assist ?Stair Management: Two rails, Step to pattern ?Number of Stairs: 4 ?General stair comments: Ascend lead with LLE, descend lead with RLE. Extra time. ? ? ?Wheelchair Mobility ?  ? ?Modified Rankin (Stroke Patients Only) ?  ? ? ?  ?Balance Overall balance assessment: Needs assistance ?Sitting-balance support: No upper extremity supported, Feet supported ?Sitting balance-Leahy Scale: Good ?  ?  ?Standing balance support: Bilateral upper extremity supported, During functional activity ?Standing balance-Leahy Scale: Fair ?  ?  ?  ?  ?  ?  ?  ?  ?  ?  ?  ?  ?  ? ?  ?Cognition Arousal/Alertness: Awake/alert ?Behavior During Therapy: [facility] for tasks assessed/performed ?Overall Cognitive Status: History of cognitive impairments - at baseline ?  ?  ?  ?  ?  ?  ?  ?  ?  ?  ?  ?  ?  ?  ?  ?  ?  ?  ?  ? ?  ?  Exercises   ? ?  ?General Comments   ?  ?  ? ?Pertinent Vitals/Pain Pain Assessment ?Pain Assessment: No/denies pain  ? ? ?Home Living   ?  ?  ?  ?  ?  ?  ?  ?  ?  ?   ?  ?Prior Function    ?  ?  ?    ? ?PT Goals (current goals can now be found in the care plan section) Acute Rehab PT Goals ?Patient Stated Goal: none stated ?PT Goal Formulation: With patient ?Time For Goal Achievement: 10/09/21 ?Potential to Achieve Goals: Fair ?Progress towards PT goals: Progressing toward goals ? ?  ?Frequency ? ? ? Min 2X/week ? ? ? ?  ?PT Plan Discharge plan needs to be updated  ? ? ?Co-evaluation   ?  ?  ?  ?  ? ?  ?AM-PAC PT "6 Clicks" Mobility   ?Outcome Measure ? Help needed turning from your back to your side while in a flat bed without using bedrails?: None ?Help needed moving from lying on your back to sitting on the side of a flat bed without using bedrails?: A Little ?Help needed moving to and from a bed to a chair (including a wheelchair)?: A Little ?Help needed standing up from a chair using your arms (e.g., wheelchair or bedside chair)?: A Little ?Help needed to walk in hospital room?: A Little ?Help needed climbing 3-5 steps with a railing? : A Little ?6 Click Score: 19 ? ?  ?End of Session Equipment Utilized During Treatment: Gait belt ?Activity Tolerance: Patient tolerated treatment well ?Patient left: in bed;with call bell/phone within reach;with bed alarm set ?Nurse Communication: Mobility status ?PT Visit Diagnosis: Muscle weakness (generalized) (M62.81);Difficulty in walking, not elsewhere classified (R26.2);Unsteadiness on feet (R26.81) ?  ? ? ?Time: [id number] ?PT Time Calculation (min) (ACUTE ONLY): 15 min ? ?Charges:  $Therapeutic Activity: 8-22 mins          ?          ? ?Lavone Nian, PT, DPT ?10/01/21, 1:02 PM ? ? ?Waunita Schooner ?10/01/2021, 1:00 PM ? ?

## 2021-10-03 LAB — HUMAN PARVOVIRUS DNA DETECTION BY PCR: Parvovirus B19, PCR: NEGATIVE

## 2021-10-04 ENCOUNTER — Other Ambulatory Visit: Payer: Self-pay

## 2021-10-04 DIAGNOSIS — D649 Anemia, unspecified: Secondary | ICD-10-CM

## 2021-10-06 ENCOUNTER — Encounter: Payer: Self-pay | Admitting: *Deleted

## 2021-10-07 ENCOUNTER — Encounter (HOSPITAL_COMMUNITY): Payer: Self-pay | Admitting: Internal Medicine

## 2021-10-15 ENCOUNTER — Encounter: Payer: Self-pay | Admitting: Nurse Practitioner

## 2021-10-15 ENCOUNTER — Inpatient Hospital Stay: Payer: Medicare Other

## 2021-10-15 ENCOUNTER — Inpatient Hospital Stay: Payer: Medicare Other | Attending: Nurse Practitioner | Admitting: Nurse Practitioner

## 2021-10-15 VITALS — BP 142/90 | HR 78 | Temp 98.0°F | Resp 20 | Wt 172.9 lb

## 2021-10-15 DIAGNOSIS — Z87891 Personal history of nicotine dependence: Secondary | ICD-10-CM | POA: Insufficient documentation

## 2021-10-15 DIAGNOSIS — F0390 Unspecified dementia without behavioral disturbance: Secondary | ICD-10-CM | POA: Insufficient documentation

## 2021-10-15 DIAGNOSIS — Z888 Allergy status to other drugs, medicaments and biological substances status: Secondary | ICD-10-CM | POA: Diagnosis not present

## 2021-10-15 DIAGNOSIS — Z8249 Family history of ischemic heart disease and other diseases of the circulatory system: Secondary | ICD-10-CM | POA: Insufficient documentation

## 2021-10-15 DIAGNOSIS — I700 Atherosclerosis of aorta: Secondary | ICD-10-CM | POA: Insufficient documentation

## 2021-10-15 DIAGNOSIS — E538 Deficiency of other specified B group vitamins: Secondary | ICD-10-CM | POA: Insufficient documentation

## 2021-10-15 DIAGNOSIS — J449 Chronic obstructive pulmonary disease, unspecified: Secondary | ICD-10-CM | POA: Diagnosis not present

## 2021-10-15 DIAGNOSIS — Z8261 Family history of arthritis: Secondary | ICD-10-CM | POA: Insufficient documentation

## 2021-10-15 DIAGNOSIS — M47816 Spondylosis without myelopathy or radiculopathy, lumbar region: Secondary | ICD-10-CM | POA: Insufficient documentation

## 2021-10-15 DIAGNOSIS — Z79899 Other long term (current) drug therapy: Secondary | ICD-10-CM | POA: Insufficient documentation

## 2021-10-15 DIAGNOSIS — I10 Essential (primary) hypertension: Secondary | ICD-10-CM | POA: Insufficient documentation

## 2021-10-15 DIAGNOSIS — I2510 Atherosclerotic heart disease of native coronary artery without angina pectoris: Secondary | ICD-10-CM | POA: Diagnosis not present

## 2021-10-15 DIAGNOSIS — N1831 Chronic kidney disease, stage 3a: Secondary | ICD-10-CM | POA: Diagnosis not present

## 2021-10-15 DIAGNOSIS — Z8673 Personal history of transient ischemic attack (TIA), and cerebral infarction without residual deficits: Secondary | ICD-10-CM | POA: Insufficient documentation

## 2021-10-15 DIAGNOSIS — Z9049 Acquired absence of other specified parts of digestive tract: Secondary | ICD-10-CM | POA: Insufficient documentation

## 2021-10-15 DIAGNOSIS — Z818 Family history of other mental and behavioral disorders: Secondary | ICD-10-CM | POA: Insufficient documentation

## 2021-10-15 DIAGNOSIS — I69354 Hemiplegia and hemiparesis following cerebral infarction affecting left non-dominant side: Secondary | ICD-10-CM | POA: Diagnosis not present

## 2021-10-15 DIAGNOSIS — Z811 Family history of alcohol abuse and dependence: Secondary | ICD-10-CM | POA: Diagnosis not present

## 2021-10-15 DIAGNOSIS — D649 Anemia, unspecified: Secondary | ICD-10-CM | POA: Diagnosis present

## 2021-10-15 DIAGNOSIS — Z823 Family history of stroke: Secondary | ICD-10-CM | POA: Diagnosis not present

## 2021-10-15 LAB — COMPREHENSIVE METABOLIC PANEL
ALT: 12 U/L (ref 0–44)
AST: 17 U/L (ref 15–41)
Albumin: 3.9 g/dL (ref 3.5–5.0)
Alkaline Phosphatase: 60 U/L (ref 38–126)
Anion gap: 9 (ref 5–15)
BUN: 15 mg/dL (ref 8–23)
CO2: 20 mmol/L — ABNORMAL LOW (ref 22–32)
Calcium: 9.2 mg/dL (ref 8.9–10.3)
Chloride: 105 mmol/L (ref 98–111)
Creatinine, Ser: 1.3 mg/dL — ABNORMAL HIGH (ref 0.44–1.00)
GFR, Estimated: 45 mL/min — ABNORMAL LOW (ref >60)
Glucose, Bld: 85 mg/dL (ref 70–99)
Potassium: 3.9 mmol/L (ref 3.5–5.1)
Sodium: 134 mmol/L — ABNORMAL LOW (ref 135–145)
Total Bilirubin: 0.4 mg/dL (ref 0.3–1.2)
Total Protein: 7.5 g/dL (ref 6.5–8.1)

## 2021-10-15 LAB — CBC WITH DIFFERENTIAL/PLATELET
Abs Immature Granulocytes: 0.02 10*3/uL (ref 0.00–0.07)
Basophils Absolute: 0 10*3/uL (ref 0.0–0.1)
Basophils Relative: 1 %
Eosinophils Absolute: 0 10*3/uL (ref 0.0–0.5)
Eosinophils Relative: 1 %
HCT: 42 % (ref 36.0–46.0)
Hemoglobin: 13.1 g/dL (ref 12.0–15.0)
Immature Granulocytes: 0 %
Lymphocytes Relative: 12 %
Lymphs Abs: 0.7 10*3/uL (ref 0.7–4.0)
MCH: 28.1 pg (ref 26.0–34.0)
MCHC: 31.2 g/dL (ref 30.0–36.0)
MCV: 90.1 fL (ref 80.0–100.0)
Monocytes Absolute: 0.4 10*3/uL (ref 0.1–1.0)
Monocytes Relative: 6 %
Neutro Abs: 4.5 10*3/uL (ref 1.7–7.7)
Neutrophils Relative %: 80 %
Platelets: 348 10*3/uL (ref 150–400)
RBC: 4.66 MIL/uL (ref 3.87–5.11)
RDW: 17.5 % — ABNORMAL HIGH (ref 11.5–15.5)
WBC: 5.6 10*3/uL (ref 4.0–10.5)
nRBC: 0 % (ref 0.0–0.2)

## 2021-10-15 LAB — SAMPLE TO BLOOD BANK

## 2021-10-15 LAB — VITAMIN B12: Vitamin B-12: 1173 pg/mL — ABNORMAL HIGH (ref 180–914)

## 2021-10-15 LAB — IRON AND TIBC
Iron: 62 ug/dL (ref 28–170)
Saturation Ratios: 19 % (ref 10.4–31.8)
TIBC: 336 ug/dL (ref 250–450)
UIBC: 274 ug/dL

## 2021-10-15 LAB — FERRITIN: Ferritin: 66 ng/mL (ref 11–307)

## 2021-10-15 LAB — FOLATE: Folate: 13.7 ng/mL (ref >5.9)

## 2021-10-15 NOTE — Progress Notes (Signed)
Patient is accompanied by her daughter today for new patient evaluation.  Did have a bone marrow biopsy during recent hospital admission. ?

## 2021-10-15 NOTE — Progress Notes (Signed)
[name] ?  ?Patient Care Team: ?Lorna Few, DO as PCP - General (Family Medicine) ?  ?CHIEF COMPLAINTS/PURPOSE OF CONSULTATION:  anemia ?  ?HISTORY OF PRESENTING ILLNESS: Patient's daughter, [name] accompanies patient. Daughter, [name], on [place].  ?Yolanda [name] 66 y.o.  female African-American female with a history of stroke with left-sided hemiplegia, history of VP shunt, hypertension, COPD, CKD stage IIIa, history of dementia s/p hospital evaluation with Dr. Rogue Bussing. During hospitalization she was found to have to have baseline hemoglobin of 14-15 but was found to have hemoglobin of 7.3. No obvious bleeding. Patient had extensive work-up for anemia including-CT scan abdomen pelvis negative; also EGD/colonoscopy negative for any acute process or source of bleeding.  ?  ?Of note patient noted to have low B12; started on B12 injections.  And also started on IV iron infusion because of low iron saturations.  Patient also needed PRBC transfusion.  Hemoglobin stable around 7.   ? ?She underwent bone marrow biopsy and presents today for results and further management. She feels at baseline and denies complaints. Her daughter reports that she has started oral b12.  ?  ?  ?Review of Systems  ?Unable to perform ROS: Dementia  ? ?  ?MEDICAL HISTORY:  ?Past Medical History:  ?Diagnosis Date  ? ALLERGIC RHINITIS 10/25/2007  ? ANXIETY 10/25/2007  ? B12 DEFICIENCY 12/01/2009  ? BACK PAIN 10/25/2007  ? BRADYCARDIA 11/19/2009  ? CEREBROVASCULAR ACCIDENT, HX OF 10/25/2007  ? CHEST PAIN 12/31/2007  ? COPD 10/25/2007  ? DEPRESSION 10/25/2007  ? FATIGUE 10/25/2007  ? GERD 10/25/2007  ? Headache(784.0) 10/25/2007  ? HEMIPARESIS, LEFT 11/23/2009  ? HYPERLIPIDEMIA 10/25/2007  ? HYPERTENSION 10/25/2007  ? INSOMNIA-SLEEP DISORDER-UNSPEC 10/25/2007  ? MITRAL INSUFFICIENCY 10/25/2007  ? OSTEOPENIA 11/19/2009  ? OSTEOPOROSIS 10/25/2007  ? PERIPHERAL VASCULAR DISEASE 10/25/2007  ? Symptomatic anemia   ?  Unspecified visual loss 11/23/2009  ? VITAMIN D DEFICIENCY 06/03/2010  ? ? ?  ?SURGICAL HISTORY: ?Past Surgical History:  ?Procedure Laterality Date  ? CHOLECYSTECTOMY    ? COLONOSCOPY WITH PROPOFOL N/A 09/27/2021  ? Procedure: COLONOSCOPY WITH PROPOFOL;  Surgeon: Lucilla Lame, MD;  Location: [facility] ENDOSCOPY;  Service: Endoscopy;  Laterality: N/A;  ? ESOPHAGOGASTRODUODENOSCOPY (EGD) WITH PROPOFOL N/A 09/27/2021  ? Procedure: ESOPHAGOGASTRODUODENOSCOPY (EGD) WITH PROPOFOL;  Surgeon: Lucilla Lame, MD;  Location: [facility] ENDOSCOPY;  Service: Endoscopy;  Laterality: N/A;  ? IR BONE MARROW BIOPSY & ASPIRATION  09/29/2021  ? TUBAL LIGATION    ? ?  ?SOCIAL HISTORY: ?  ?Social History  ? ?Socioeconomic History  ? Marital status: Legally Separated  ?  Spouse name: Not on file  ? Number of children: 3  ? Years of education: Not on file  ? Highest education level: Not on file  ?Occupational History  ? Occupation: former Fish farm manager - disabled since 2005  ?Tobacco Use  ? Smoking status: Former  ? Smokeless tobacco: Never  ?Substance and Sexual Activity  ? Alcohol use: Yes  ? Drug use: No  ? Sexual activity: Not on file  ?Other Topics Concern  ? Not on file  ?Social History Narrative  ? Not on file  ? ?Social Determinants of Health  ? ?Financial Resource Strain: Not on file  ?Food Insecurity: Not on file  ?Transportation Needs: Not on file  ?Physical Activity: Not on file  ?Stress: Not on file  ?Social Connections: Not on file  ?Intimate Partner Violence: Not on file  ? ? ?  ?FAMILY HISTORY: ?  Family History  ?Problem Relation Age of Onset  ? Alcohol abuse Other   ?     ETOH Dependence  ? Arthritis Other   ? Heart disease Other   ? Hypertension Other   ? Stroke Other   ? Depression Other   ? Cancer Neg Hx   ? ?  ?ALLERGIES:  is allergic to latex and ace inhibitors. ?Allergies  ?Allergen Reactions  ? Latex Swelling  ?  Lip swelled after having hair colored by person wearing latex gloves  ? Ace Inhibitors   ? Beta Adrenergic Blockers  Other (See Comments)  ?  Bradycardia  ? Clonidine Other (See Comments)  ?  Fatigue ?  ? Escitalopram   ? Escitalopram Oxalate Nausea Only  ? ? ?  ?MEDICATIONS:  ?Current Outpatient Medications on File Prior to Visit  ?Medication Sig Dispense Refill  ? alendronate (FOSAMAX) 70 MG tablet TAKE 1 TABLET BY MOUTH ONCE A WEEK WITH GLASS OF WATER ON AN EMPTY STOMACH (Patient taking differently: Take 70 mg by mouth every Tuesday.) 12 tablet 1  ? aspirin EC 81 MG tablet Take 81 mg by mouth in the morning.    ? Calcium Carbonate-Vit D-Min (CALCIUM 600+D3 PLUS MINERALS) 600-800 MG-UNIT TABS Take 1 tablet by mouth daily.    ? hydrALAZINE (APRESOLINE) 100 MG tablet Take 100 mg by mouth 2 (two) times daily.    ? lovastatin (MEVACOR) 20 MG tablet 3 tabs by mouth per day (Patient taking differently: Take 20 mg by mouth daily with supper.) 270 tablet 3  ? melatonin 3 MG TABS tablet Take 3 mg by mouth at bedtime.    ? mirtazapine (REMERON) 7.5 MG tablet Take 7.5 mg by mouth at bedtime.    ? ondansetron (ZOFRAN-ODT) 4 MG disintegrating tablet Take 4 mg by mouth every 8 (eight) hours as needed.    ? potassium chloride SA (KLOR-CON) 20 MEQ tablet Take 20 mEq by mouth in the morning.    ? spironolactone (ALDACTONE) 25 MG tablet Take 25 mg by mouth daily.    ? vitamin B-12 1000 MCG tablet Take 1 tablet (1,000 mcg total) by mouth daily. 30 tablet   ? amLODipine (NORVASC) 10 MG tablet Take 1 tablet (10 mg total) by mouth daily. (Patient not taking: Reported on 10/15/2021) 90 tablet 3  ? ?No current facility-administered medications on file prior to visit.  ? ? ?  ?PHYSICAL EXAMINATION: ?Today's Vitals  ? 10/15/21 1100  ?BP: (!) 142/90  ?Pulse: 78  ?Resp: 20  ?Temp: 98 ?F (36.7 ?C)  ?Weight: 172 lb 14.4 oz (78.4 kg)  ?PainSc: 0-No pain  ? ?Body mass index is 27.08 kg/m?. ?  ?  ?Physical Exam ?Vitals reviewed.  ?Constitutional:   ?   Appearance: She is not ill-appearing.  ?Eyes:  ?   General: No scleral icterus. ?Cardiovascular:  ?   Rate and  Rhythm: Normal rate and regular rhythm.  ?Pulmonary:  ?   Effort: No respiratory distress.  ?   Breath sounds: Normal breath sounds.  ?Abdominal:  ?   General: There is no distension.  ?   Tenderness: There is no abdominal tenderness.  ?Musculoskeletal:     ?   General: No swelling or deformity.  ?   Cervical back: Neck supple.  ?Lymphadenopathy:  ?   Cervical: No cervical adenopathy.  ?Skin: ?   Coloration: Skin is not pale.  ?Neurological:  ?   Mental Status: She is alert. Mental status is at baseline.  ?  Psychiatric:     ?   Behavior: Behavior normal.  ? ? ?  ?LABORATORY DATA:  ?I have reviewed the data as listed ? ?  Latest Ref Rng & Units 10/15/2021  ? 10:20 AM 10/01/2021  ?  4:14 AM 09/30/2021  ?  7:24 PM  ?CBC  ?WBC 4.0 - 10.5 K/uL 5.6   4.7     ?Hemoglobin 12.0 - 15.0 g/dL 13.1   9.6   9.3    ?Hematocrit 36.0 - 46.0 % 42.0   30.9     ?Platelets 150 - 400 K/uL 348   244     ? ? ?  Latest Ref Rng & Units 10/15/2021  ? 10:20 AM 10/01/2021  ?  4:14 AM 09/30/2021  ?  4:28 AM  ?CMP  ?Glucose 70 - 99 mg/dL 85   95   98    ?BUN 8 - 23 mg/dL _0 ?Creatinine 0.44 - 1.00 mg/dL 1.30   1.55   1.83    ?Sodium 135 - 145 mmol/L 134   136   134    ?Potassium 3.5 - 5.1 mmol/L 3.9   3.0   3.5    ?Chloride 98 - 111 mmol/L 105   102   103    ?CO2 22 - 32 mmol/L _1 ?Calcium 8.9 - 10.3 mg/dL 9.2   8.2   8.1    ?Total Protein 6.5 - 8.1 g/dL 7.5      ?Total Bilirubin 0.3 - 1.2 mg/dL 0.4      ?Alkaline Phos 38 - 126 U/L 60      ?AST 15 - 41 U/L 17      ?ALT 0 - 44 U/L 12      ? ?Lab Results  ?Component Value Date  ? VITAMINB12 115 (L) 09/25/2021  ? ?Iron/TIBC/Ferritin/ %Sat ?   ?Component Value Date/Time  ? IRON 67 09/25/2021 0758  ? TIBC 314 09/25/2021 0758  ? FERRITIN 31 09/25/2021 0758  ? IRONPCTSAT 21 09/25/2021 0758  ? ? ?  ?  ?RADIOGRAPHIC STUDIES: ?I have personally reviewed the radiological images as listed and agreed with the findings in the report. ? ?Imaging Results  ?CT ABDOMEN PELVIS WO CONTRAST ?   ?Result Date: 09/25/2021 ?CLINICAL DATA:  Acute abdominal pain.  Weakness. EXAM: CT ABDOMEN AND PELVIS WITHOUT CONTRAST TECHNIQUE: Multidetector CT imaging of the abdomen and pelvis was performed followin

## 2021-12-16 ENCOUNTER — Inpatient Hospital Stay: Payer: Medicare Other

## 2021-12-16 ENCOUNTER — Inpatient Hospital Stay: Payer: Medicare Other | Admitting: Internal Medicine

## 2021-12-24 ENCOUNTER — Encounter: Payer: Self-pay | Admitting: Internal Medicine

## 2021-12-24 ENCOUNTER — Inpatient Hospital Stay: Payer: Medicare Other | Attending: Internal Medicine

## 2021-12-24 ENCOUNTER — Inpatient Hospital Stay: Payer: Medicare Other | Attending: Nurse Practitioner | Admitting: Internal Medicine

## 2021-12-24 ENCOUNTER — Other Ambulatory Visit: Payer: Self-pay

## 2021-12-24 DIAGNOSIS — Z87891 Personal history of nicotine dependence: Secondary | ICD-10-CM | POA: Diagnosis not present

## 2021-12-24 DIAGNOSIS — D649 Anemia, unspecified: Secondary | ICD-10-CM

## 2021-12-24 DIAGNOSIS — Z79899 Other long term (current) drug therapy: Secondary | ICD-10-CM | POA: Diagnosis not present

## 2021-12-24 DIAGNOSIS — Z9049 Acquired absence of other specified parts of digestive tract: Secondary | ICD-10-CM | POA: Diagnosis not present

## 2021-12-24 DIAGNOSIS — Z8249 Family history of ischemic heart disease and other diseases of the circulatory system: Secondary | ICD-10-CM | POA: Insufficient documentation

## 2021-12-24 DIAGNOSIS — Z818 Family history of other mental and behavioral disorders: Secondary | ICD-10-CM | POA: Insufficient documentation

## 2021-12-24 DIAGNOSIS — Z811 Family history of alcohol abuse and dependence: Secondary | ICD-10-CM | POA: Diagnosis not present

## 2021-12-24 DIAGNOSIS — Z8673 Personal history of transient ischemic attack (TIA), and cerebral infarction without residual deficits: Secondary | ICD-10-CM | POA: Diagnosis not present

## 2021-12-24 DIAGNOSIS — N183 Chronic kidney disease, stage 3 unspecified: Secondary | ICD-10-CM | POA: Diagnosis not present

## 2021-12-24 DIAGNOSIS — Z823 Family history of stroke: Secondary | ICD-10-CM | POA: Diagnosis not present

## 2021-12-24 DIAGNOSIS — Z8261 Family history of arthritis: Secondary | ICD-10-CM | POA: Diagnosis not present

## 2021-12-24 LAB — CBC WITH DIFFERENTIAL/PLATELET
Abs Immature Granulocytes: 0.02 10*3/uL (ref 0.00–0.07)
Basophils Absolute: 0 10*3/uL (ref 0.0–0.1)
Basophils Relative: 1 %
Eosinophils Absolute: 0 10*3/uL (ref 0.0–0.5)
Eosinophils Relative: 1 %
HCT: 46.7 % — ABNORMAL HIGH (ref 36.0–46.0)
Hemoglobin: 14.8 g/dL (ref 12.0–15.0)
Immature Granulocytes: 0 %
Lymphocytes Relative: 17 %
Lymphs Abs: 0.8 10*3/uL (ref 0.7–4.0)
MCH: 26.9 pg (ref 26.0–34.0)
MCHC: 31.7 g/dL (ref 30.0–36.0)
MCV: 84.9 fL (ref 80.0–100.0)
Monocytes Absolute: 0.4 10*3/uL (ref 0.1–1.0)
Monocytes Relative: 7 %
Neutro Abs: 3.5 10*3/uL (ref 1.7–7.7)
Neutrophils Relative %: 74 %
Platelets: 283 10*3/uL (ref 150–400)
RBC: 5.5 MIL/uL — ABNORMAL HIGH (ref 3.87–5.11)
RDW: 14.4 % (ref 11.5–15.5)
WBC: 4.8 10*3/uL (ref 4.0–10.5)
nRBC: 0 % (ref 0.0–0.2)

## 2021-12-24 LAB — COMPREHENSIVE METABOLIC PANEL
ALT: 14 U/L (ref 0–44)
AST: 17 U/L (ref 15–41)
Albumin: 4.1 g/dL (ref 3.5–5.0)
Alkaline Phosphatase: 57 U/L (ref 38–126)
Anion gap: 12 (ref 5–15)
BUN: 24 mg/dL — ABNORMAL HIGH (ref 8–23)
CO2: 22 mmol/L (ref 22–32)
Calcium: 9.3 mg/dL (ref 8.9–10.3)
Chloride: 103 mmol/L (ref 98–111)
Creatinine, Ser: 1.63 mg/dL — ABNORMAL HIGH (ref 0.44–1.00)
GFR, Estimated: 34 mL/min — ABNORMAL LOW (ref >60)
Glucose, Bld: 84 mg/dL (ref 70–99)
Potassium: 4.5 mmol/L (ref 3.5–5.1)
Sodium: 137 mmol/L (ref 135–145)
Total Bilirubin: 0.6 mg/dL (ref 0.3–1.2)
Total Protein: 8.7 g/dL — ABNORMAL HIGH (ref 6.5–8.1)

## 2021-12-24 LAB — SAMPLE TO BLOOD BANK

## 2021-12-24 LAB — VITAMIN B12: Vitamin B-12: 988 pg/mL — ABNORMAL HIGH (ref 180–914)

## 2021-12-24 NOTE — Progress Notes (Signed)
[facility] CONSULT NOTE  Patient Care Team: Araceli Bouche, DO as PCP - General (Family Medicine) Alinda Dooms, NP as Nurse Practitioner (Nurse Practitioner) Earna Coder, MD as Consulting Physician (Oncology)  CHIEF COMPLAINTS/PURPOSE OF CONSULTATION: ANEMIA   HEMATOLOGY HISTORY  # MARCH 2023-ANEMIA-acute severe anemia   HISTORY OF PRESENTING ILLNESS: Accompanied by daughter.  Ambulating independently.  Yolanda [name] 66 y.o.  female pleasant patient is here for follow-up of anemia.  Patient was admitted to the hospital in March 2023 for severe anemia hemoglobin around 6. PRBC transfusion.  S/p evaluation with GI.  No signs of bleeding noted.  Patient underwent a bone marrow biopsy-acute hyper aplasia but no acute process.   Patient denies any new symptoms.  Review of Systems  Constitutional:  Negative for chills, diaphoresis, fever, malaise/fatigue and weight loss.  HENT:  Negative for nosebleeds and sore throat.   Eyes:  Negative for double vision.  Respiratory:  Negative for cough, hemoptysis, sputum production, shortness of breath and wheezing.   Cardiovascular:  Negative for chest pain, palpitations, orthopnea and leg swelling.  Gastrointestinal:  Negative for abdominal pain, blood in stool, constipation, diarrhea, heartburn, melena, nausea and vomiting.  Genitourinary:  Negative for dysuria, frequency and urgency.  Musculoskeletal:  Negative for back pain and joint pain.  Skin: Negative.  Negative for itching and rash.  Neurological:  Negative for dizziness, tingling, focal weakness, weakness and headaches.  Endo/Heme/Allergies:  Does not bruise/bleed easily.  Psychiatric/Behavioral:  Negative for depression. The patient is not nervous/anxious and does not have insomnia.      MEDICAL HISTORY:  Past Medical History:  Diagnosis Date   ALLERGIC RHINITIS 10/25/2007   ANXIETY 10/25/2007   B12 DEFICIENCY 12/01/2009   BACK PAIN 10/25/2007    BRADYCARDIA 11/19/2009   CEREBROVASCULAR ACCIDENT, HX OF 10/25/2007   CHEST PAIN 12/31/2007   COPD 10/25/2007   DEPRESSION 10/25/2007   FATIGUE 10/25/2007   GERD 10/25/2007   Headache(784.0) 10/25/2007   HEMIPARESIS, LEFT 11/23/2009   HYPERLIPIDEMIA 10/25/2007   HYPERTENSION 10/25/2007   INSOMNIA-SLEEP DISORDER-UNSPEC 10/25/2007   MITRAL INSUFFICIENCY 10/25/2007   OSTEOPENIA 11/19/2009   OSTEOPOROSIS 10/25/2007   PERIPHERAL VASCULAR DISEASE 10/25/2007   Symptomatic anemia    Unspecified visual loss 11/23/2009   VITAMIN D DEFICIENCY 06/03/2010    SURGICAL HISTORY: Past Surgical History:  Procedure Laterality Date   CHOLECYSTECTOMY     COLONOSCOPY WITH PROPOFOL N/A 09/27/2021   Procedure: COLONOSCOPY WITH PROPOFOL;  Surgeon: Midge Minium, MD;  Location[facility] ENDOSCOPY;  Service: Endoscopy;  Laterality: N/A;   ESOPHAGOGASTRODUODENOSCOPY (EGD) WITH PROPOFOL N/A 09/27/2021   Procedure: ESOPHAGOGASTRODUODENOSCOPY (EGD) WITH PROPOFOL;  Surgeon: Midge Minium, MD;  Location: [facility] ENDOSCOPY;  Service: Endoscopy;  Laterality: N/A;   IR BONE MARROW BIOPSY & ASPIRATION  09/29/2021   TUBAL LIGATION      SOCIAL HISTORY: Social History   Socioeconomic History   Marital status: Legally Separated    Spouse name: Not on file   Number of children: 3   Years of education: Not on file   Highest education level: Not on file  Occupational History   Occupation: former Runner, broadcasting/film/video - disabled since 2005  Tobacco Use   Smoking status: Former   Smokeless tobacco: Never  Substance and Sexual Activity   Alcohol use: Yes   Drug use: No   Sexual activity: Not on file  Other Topics Concern   Not on file  Social History Narrative   Not on file  Social Determinants of Health   Financial Resource Strain: Not on file  Food Insecurity: Not on file  Transportation Needs: Not on file  Physical Activity: Not on file  Stress: Not on file  Social Connections: Not on file  Intimate Partner  Violence: Not on file    FAMILY HISTORY: Family History  Problem Relation Age of Onset   Alcohol abuse Other        ETOH Dependence   Arthritis Other    Heart disease Other    Hypertension Other    Stroke Other    Depression Other    Cancer Neg Hx     ALLERGIES:  is allergic to latex, ace inhibitors, beta adrenergic blockers, clonidine, escitalopram, and escitalopram oxalate.  MEDICATIONS:  Current Outpatient Medications  Medication Sig Dispense Refill   alendronate (FOSAMAX) 70 MG tablet TAKE 1 TABLET BY MOUTH ONCE A WEEK WITH GLASS OF WATER ON AN EMPTY STOMACH (Patient taking differently: Take 70 mg by mouth every Tuesday.) 12 tablet 1   aspirin EC 81 MG tablet Take 81 mg by mouth in the morning.     Calcium Carbonate-Vit D-Min (CALCIUM 600+D3 PLUS MINERALS) 600-800 MG-UNIT TABS Take 1 tablet by mouth daily.     hydrALAZINE (APRESOLINE) 100 MG tablet Take 100 mg by mouth 2 (two) times daily.     lovastatin (MEVACOR) 20 MG tablet 3 tabs by mouth per day (Patient taking differently: Take 20 mg by mouth daily with supper.) 270 tablet 3   melatonin 3 MG TABS tablet Take 3 mg by mouth at bedtime.     mirtazapine (REMERON) 7.5 MG tablet Take 7.5 mg by mouth at bedtime.     potassium chloride SA (KLOR-CON) 20 MEQ tablet Take 20 mEq by mouth in the morning.     spironolactone (ALDACTONE) 25 MG tablet Take 25 mg by mouth daily.     vitamin B-12 1000 MCG tablet Take 1 tablet (1,000 mcg total) by mouth daily. 30 tablet    amLODipine (NORVASC) 10 MG tablet Take 1 tablet (10 mg total) by mouth daily. (Patient not taking: Reported on 10/15/2021) 90 tablet 3   ondansetron (ZOFRAN-ODT) 4 MG disintegrating tablet Take 4 mg by mouth every 8 (eight) hours as needed. (Patient not taking: Reported on 12/24/2021)     No current facility-administered medications for this visit.     Marland Kitchen  PHYSICAL EXAMINATION:   Vitals:   12/24/21 1518  BP: (!) 134/96  Pulse: 75  Temp: 97.6 F (36.4 C)  SpO2: 99%    Filed Weights   12/24/21 1518  Weight: 180 lb 14.4 oz (82.1 kg)    Physical Exam Vitals and nursing note reviewed.  HENT:     Head: Normocephalic and atraumatic.     Mouth/Throat:     Pharynx: Oropharynx is clear.  Eyes:     Extraocular Movements: Extraocular movements intact.     Pupils: Pupils are equal, round, and reactive to light.  Cardiovascular:     Rate and Rhythm: Normal rate and regular rhythm.  Pulmonary:     Comments: Decreased breath sounds bilaterally.  Abdominal:     Palpations: Abdomen is soft.  Musculoskeletal:        General: Normal range of motion.     Cervical back: Normal range of motion.  Skin:    General: Skin is warm.  Neurological:     General: No focal deficit present.     Mental Status: She is alert and oriented to person, place,  and time.  Psychiatric:        Behavior: Behavior normal.        Judgment: Judgment normal.      LABORATORY DATA:  I have reviewed the data as listed Lab Results  Component Value Date   WBC 4.8 12/24/2021   HGB 14.8 12/24/2021   HCT 46.7 (H) 12/24/2021   MCV 84.9 12/24/2021   PLT 283 12/24/2021   Recent Labs    09/26/21 0504 09/28/21 1358 10/01/21 0414 10/15/21 1020 12/24/21 1517  NA 134*   < > 136 134* 137  K 4.0   < > 3.0* 3.9 4.5  CL 105   < > 102 105 103  CO2 22   < > 24 20* 22  GLUCOSE 92   < > 95 85 84  BUN 21   < > 12 15 24*  CREATININE 2.04*   < > 1.55* 1.30* 1.63*  CALCIUM 8.2*   < > 8.2* 9.2 9.3  GFRNONAA 26*   < > 36* 45* 34*  PROT 6.2*  --   --  7.5 8.7*  ALBUMIN 2.9*  --   --  3.9 4.1  AST 11*  --   --  17 17  ALT 7  --   --  12 14  ALKPHOS 47  --   --  60 57  BILITOT 1.4*  --   --  0.4 0.6   < > = values in this interval not displayed.     No results found.  ASSESSMENT & PLAN:   Symptomatic anemia #March 2023 [hospitalized] severe anemia hemoglobin around 6. PRBC transfusion.  S/p evaluation with GI.  No signs of bleeding noted.  Patient underwent a bone marrow  biopsy-acute hyper aplasia but no acute process.  Extensive work-up including-viral infection parvo negative.  I suspect clinically viral infection causing patient's severe anemia-which was unidentified.    #Noted to have spontaneous recovery of her red blood cells.  Hemoglobin is 14.   #Chronic kidney disease-stage III.  Continue follow-up with nephrology.   #No further follow-up recommended.  Patient follow-up with PCP.  # DISPOSITION: # follow up as needed-Dr.B    All questions were answered. The patient knows to call the clinic with any problems, questions or concerns.    Cammie Sickle, MD 12/26/2021 9:43 PM

## 2021-12-24 NOTE — Assessment & Plan Note (Signed)
#  March 2023 [hospitalized] severe anemia hemoglobin around 6. PRBC transfusion.  S/p evaluation with GI.  No signs of bleeding noted.  Patient underwent a bone marrow biopsy-acute hyper aplasia but no acute process.  Extensive work-up including-viral infection parvo negative.  I suspect clinically viral infection causing patient's severe anemia-which was unidentified.    #Noted to have spontaneous recovery of her red blood cells.  Hemoglobin is 14.   #Chronic kidney disease-stage III.  Continue follow-up with nephrology.   #No further follow-up recommended.  Patient follow-up with PCP.  # DISPOSITION: # follow up as needed-Dr.B

## 2022-04-01 IMAGING — US US EXTREM LOW VENOUS*R*
1 series · 13 of 24 positions shown · non-contrast
Comparison: None.

CLINICAL DATA: Right lower extremity pain. Evaluate for deep venous
thrombosis.



[Series 1: us venous img lower uni right (dvt) · portal-venous · 13 of 37 slices shown]
[im 1/37]
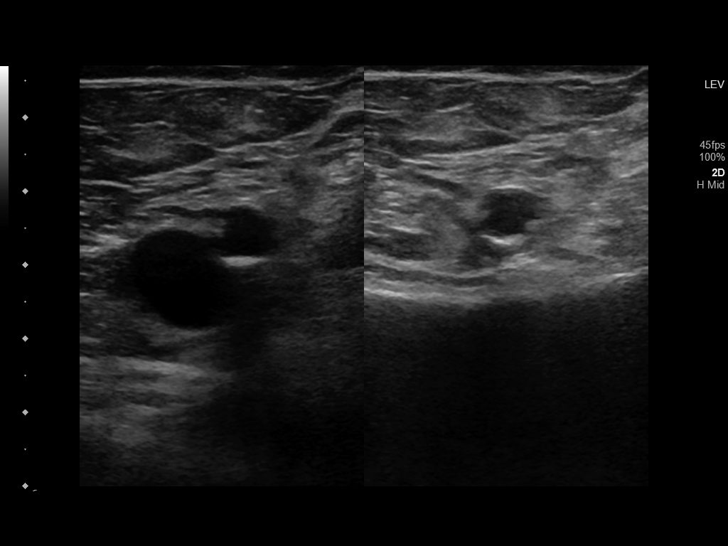
[im 4/37]
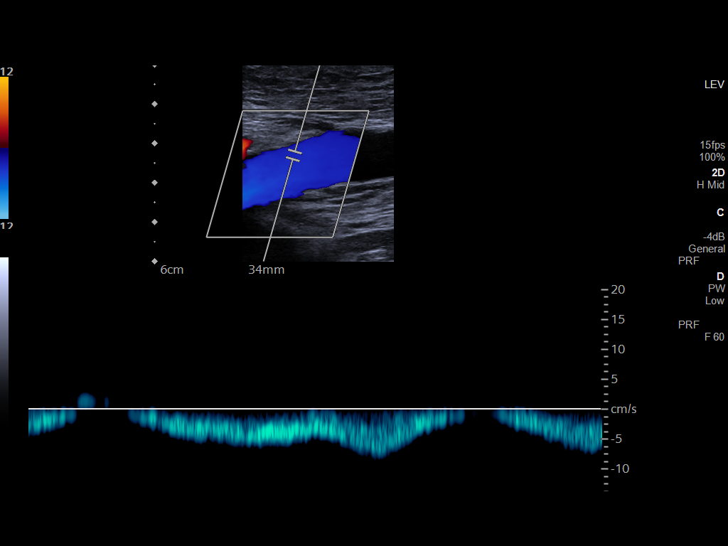
[im 7/37]
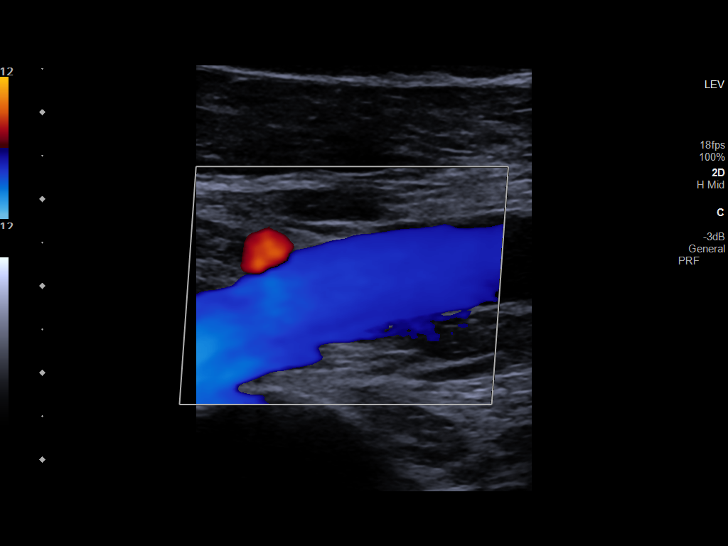
[im 10/37]
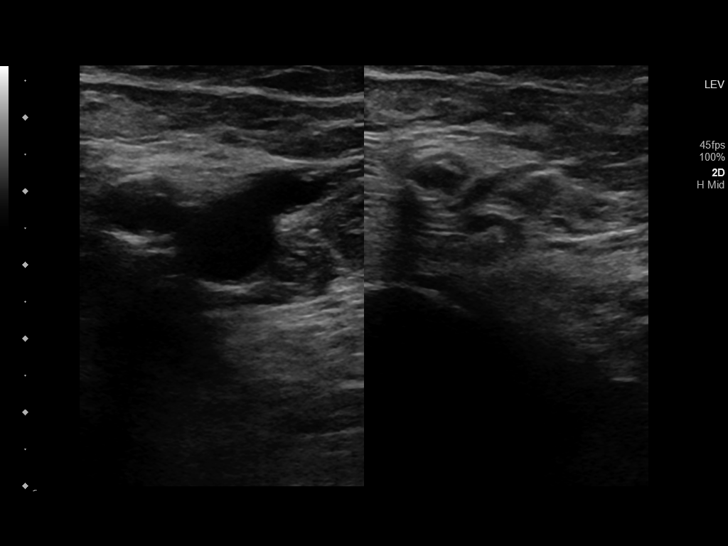
[im 13/37]
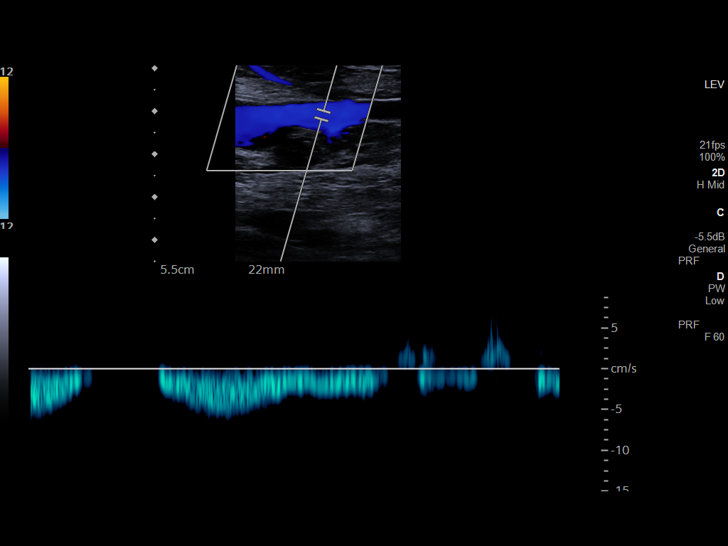
[im 16/37]
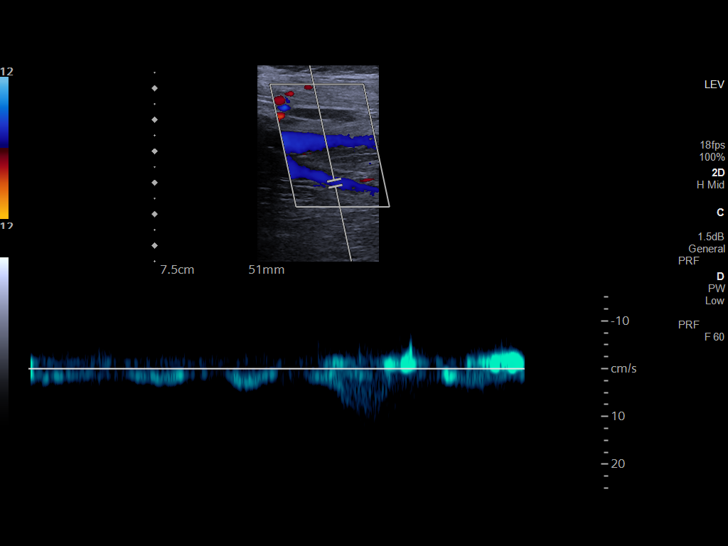
[im 19/37]
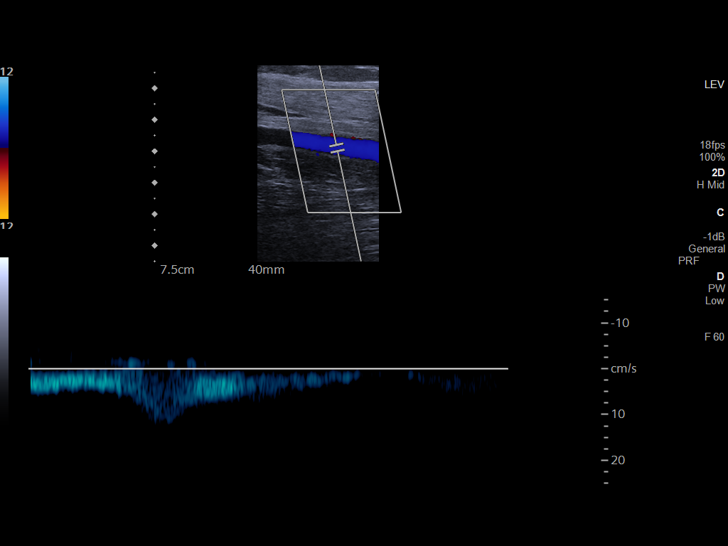
[im 21/37]
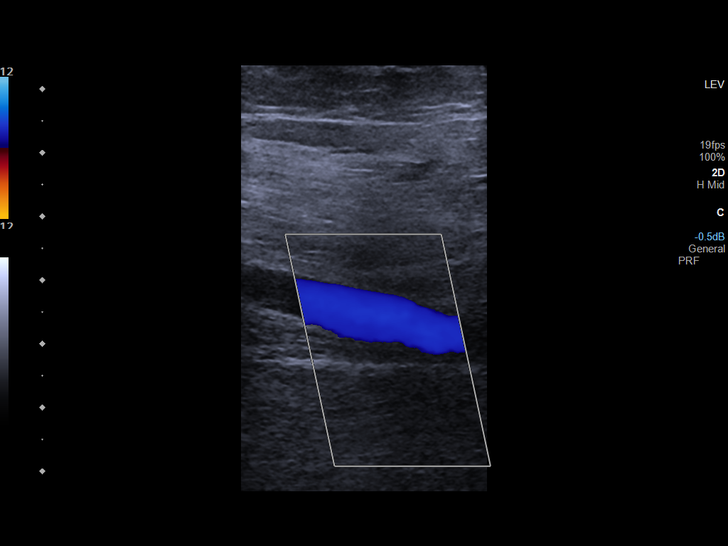
[im 24/37]
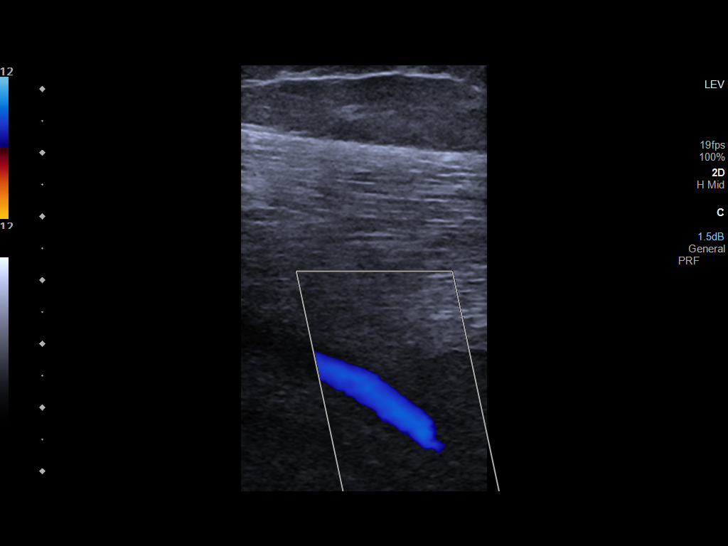
[im 27/37]
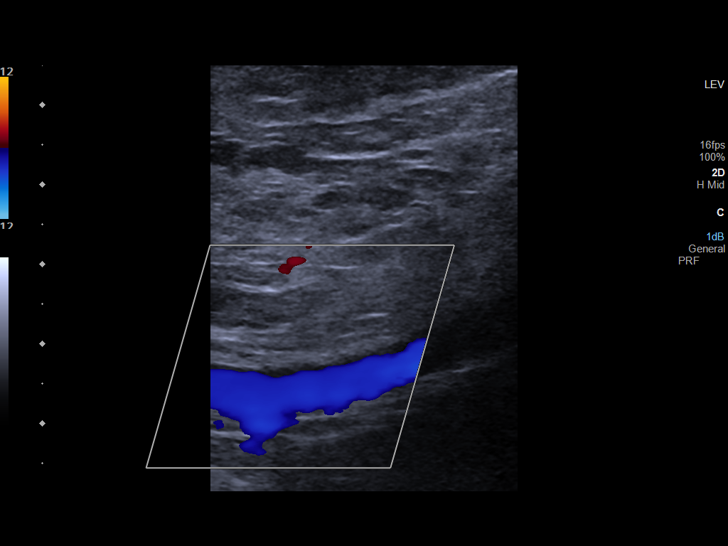
[im 30/37]
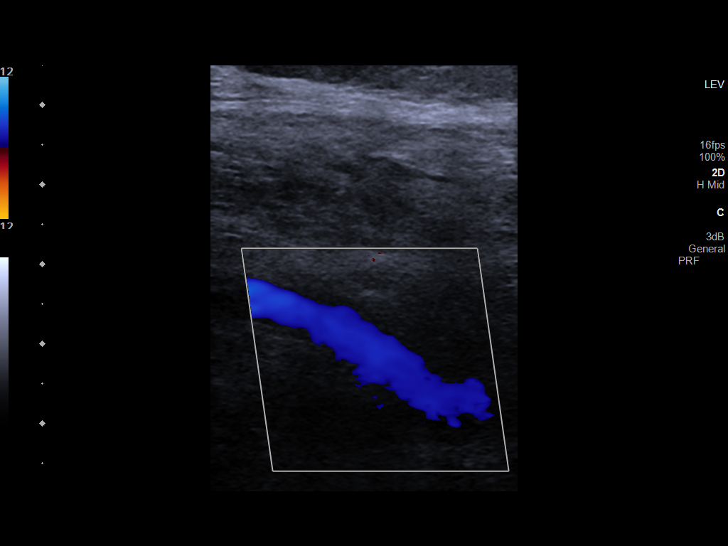
[im 33/37]
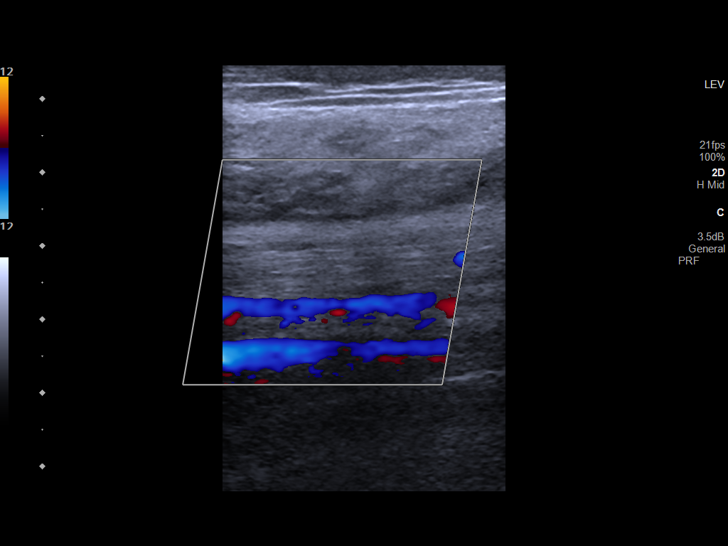
[im 37/37]
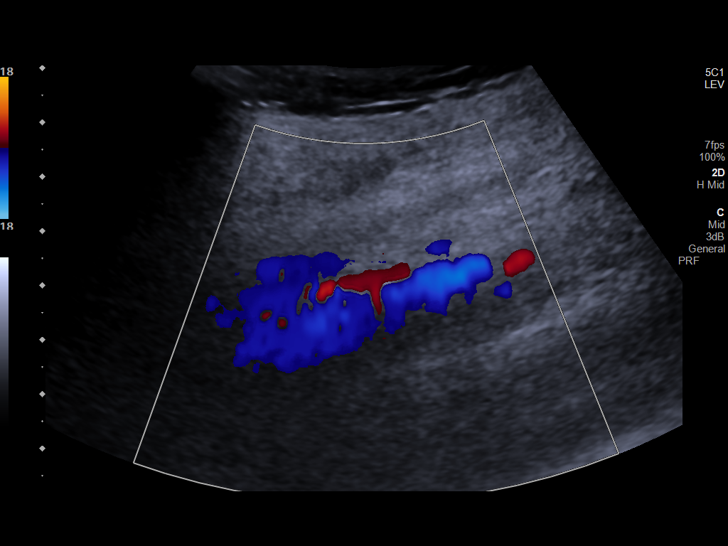

[13 of 24 positions shown; findings below may reference images not displayed]

FINDINGS: Contralateral Common Femoral Vein: Respiratory phasicity is normal
and symmetric with the symptomatic side. No evidence of thrombus.
Normal compressibility.

Common Femoral Vein: No evidence of thrombus. Normal
compressibility, respiratory phasicity and response to augmentation.

Saphenofemoral Junction: No evidence of thrombus. Normal
compressibility and flow on color Doppler imaging.

Profunda Femoral Vein: No evidence of thrombus. Normal
compressibility and flow on color Doppler imaging.

Femoral Vein: No evidence of thrombus. Normal compressibility,
respiratory phasicity and response to augmentation.

Popliteal Vein: No evidence of thrombus. Normal compressibility,
respiratory phasicity and response to augmentation.

Calf Veins: No evidence of thrombus. Normal compressibility and flow
on color Doppler imaging.

Other Findings:  None.
IMPRESSION: Negative for deep venous thrombosis in right lower extremity.

## 2022-04-04 IMAGING — XA IR BIOPSY AND ASPIRATION BONE MARROW
1 series · 1 of 1 positions shown · non-contrast
Comparison: none

INDICATION: Anemia

[Series 2: interv standard · 1 of 1 slices shown]
[im 1/1]
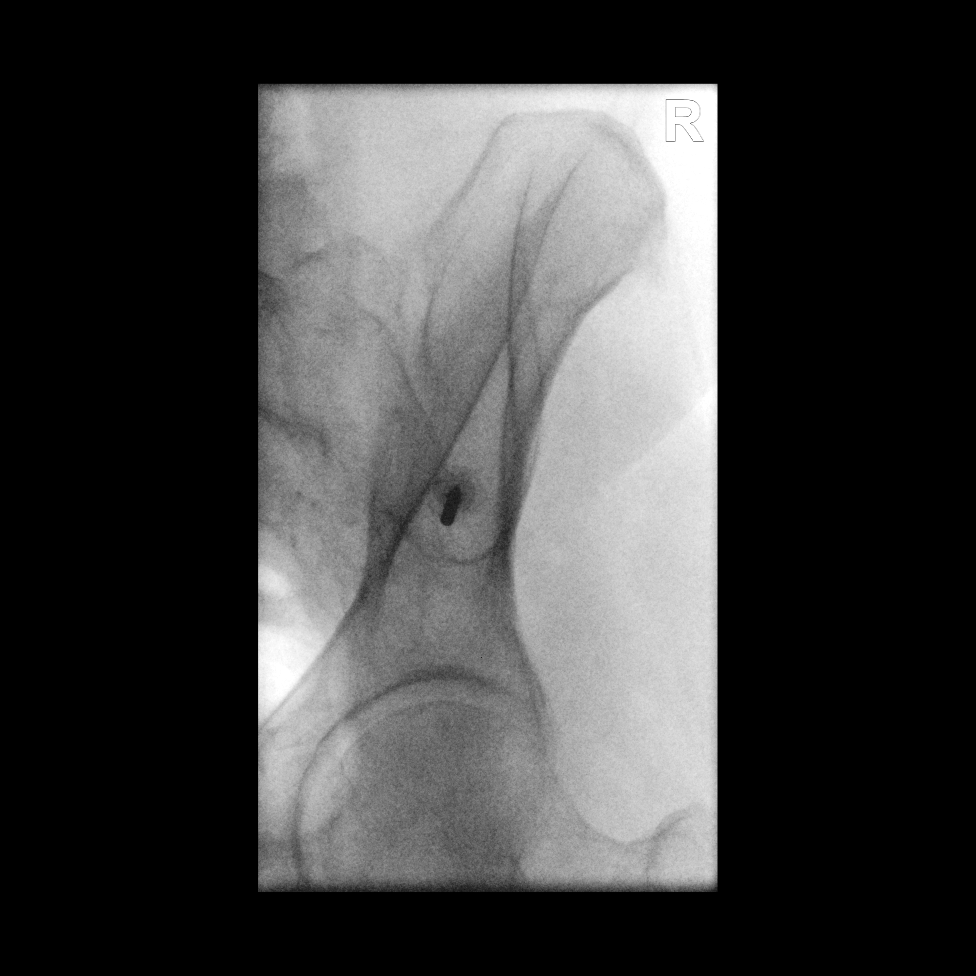

[1 of 1 positions shown; findings below may reference images not displayed]

EXAM:
IR [REDACTED] BIOPY AND ASPIRATION

MEDICATIONS:
None.

ANESTHESIA/SEDATION:
Moderate (conscious) sedation was employed during this procedure. A
total of Versed 2 mg and Fentanyl 100 mcg was administered
intravenously.

Moderate Sedation Time: 13 minutes. The patient's level of
consciousness and vital signs were monitored continuously by
radiology nursing throughout the procedure under my direct
supervision.

FLUOROSCOPY TIME:  Fluoroscopy Time: 2.1 minutes (18 mGy)

COMPLICATIONS:
None immediate.

PROCEDURE:
Informed written consent was obtained from the patient after a
thorough discussion of the procedural risks, benefits and
alternatives. All questions were addressed. Maximal Sterile Barrier
Technique was utilized including caps, mask, sterile gowns, sterile
gloves, sterile drape, hand hygiene and skin antiseptic. A timeout
was performed prior to the initiation of the procedure.

The patient was placed prone on the IR exam table. Fluoroscopy was
used for planning purposes and to identify the right posterior
ilium. Skin entry site was marked, and the overlying skin was
prepped and draped in the standard sterile fashion. Local analgesia
was obtained with 1% lidocaine. Using fluoroscopic guidance, an 11
gauge needle was advanced just deep to the cortex of the right
posterior ilium. Subsequently, bone marrow aspiration and core
biopsy were performed. Specimens were submitted to lab/pathology for
handling. Hemostasis was achieved with manual pressure, and a clean
dressing was placed. The patient tolerated the procedure well
without immediate complication.
IMPRESSION: Successful bone marrow aspiration and core biopsy of the right
posterior ilium using fluoroscopic guidance.

## 2022-05-16 ENCOUNTER — Encounter (INDEPENDENT_AMBULATORY_CARE_PROVIDER_SITE_OTHER): Payer: Self-pay

## 2022-06-27 ENCOUNTER — Ambulatory Visit (INDEPENDENT_AMBULATORY_CARE_PROVIDER_SITE_OTHER): Payer: Medicare Other | Admitting: Podiatry

## 2022-06-27 VITALS — BP 149/94 | HR 75

## 2022-06-27 DIAGNOSIS — B351 Tinea unguium: Secondary | ICD-10-CM | POA: Diagnosis not present

## 2022-06-27 DIAGNOSIS — M79674 Pain in right toe(s): Secondary | ICD-10-CM

## 2022-06-27 DIAGNOSIS — M79675 Pain in left toe(s): Secondary | ICD-10-CM | POA: Diagnosis not present

## 2022-06-27 NOTE — Progress Notes (Signed)
  Subjective:  Patient ID: Yolanda [name], female    DOB: 11-20-1955,  MRN: [id number]  Chief Complaint  Patient presents with   Nail Problem    Thick toenails that the patient cannot trim herself    66 y.o. female presents with the above complaint. History confirmed with patient.  31 if they are causing pain.  She has a caregiver here with her today as well.  Objective:  Physical Exam: warm, good capillary refill, no trophic changes or ulcerative lesions, normal DP and PT pulses, and normal sensory exam. Left Foot: dystrophic yellowed discolored nail plates with subungual debris Right Foot: dystrophic yellowed discolored nail plates with subungual debris  Assessment:   1. Pain due to onychomycosis of toenails of both feet      Plan:  Patient was evaluated and treated and all questions answered.  Discussed the etiology and treatment options for the condition in detail with the patient. Educated patient on the topical and oral treatment options for mycotic nails. Recommended debridement of the nails today. Sharp and mechanical debridement performed of all painful and mycotic nails today. Nails debrided in length and thickness using a nail nipper to level of comfort. Discussed treatment options including appropriate shoe gear. Follow up as needed for painful nails.   Return in about 3 months (around 09/26/2022) for RFC.

## 2022-06-28 ENCOUNTER — Telehealth: Payer: Self-pay | Admitting: Podiatry

## 2022-06-28 NOTE — Telephone Encounter (Signed)
Daughter called Unhappy with her moms services. Her mothers caretaker brought her into see th Doctor and told the daughter her nails were still long and were jagged with no filing. Daughter is upset that now she has to spend money for Mom to get a pedi to fix it and feels the visit was a waste and wants to know what we are going to do about it.     Please advise

## 2022-09-26 ENCOUNTER — Ambulatory Visit: Payer: Medicare Other | Admitting: Podiatry

## 2022-10-10 ENCOUNTER — Ambulatory Visit: Payer: 59 | Admitting: Podiatry

## 2022-10-26 ENCOUNTER — Ambulatory Visit (INDEPENDENT_AMBULATORY_CARE_PROVIDER_SITE_OTHER): Payer: 59 | Admitting: Podiatry

## 2022-10-26 ENCOUNTER — Encounter: Payer: Self-pay | Admitting: Podiatry

## 2022-10-26 DIAGNOSIS — M79674 Pain in right toe(s): Secondary | ICD-10-CM | POA: Diagnosis not present

## 2022-10-26 DIAGNOSIS — B351 Tinea unguium: Secondary | ICD-10-CM

## 2022-10-26 DIAGNOSIS — M79675 Pain in left toe(s): Secondary | ICD-10-CM

## 2022-10-26 NOTE — Progress Notes (Signed)
Subjective:  Patient ID: Yolanda [name], female    DOB: March 20, 1956,  MRN: [id number] HPI Chief Complaint  Patient presents with   Debridement    Trim toenails    67 y.o. female presents with the above complaint.   ROS: Denies fever chills nausea vomit muscle aches pains calf pain back pain chest pain shortness of breath.  Past Medical History:  Diagnosis Date   ALLERGIC RHINITIS 10/25/2007   ANXIETY 10/25/2007   B12 DEFICIENCY 12/01/2009   BACK PAIN 10/25/2007   BRADYCARDIA 11/19/2009   CEREBROVASCULAR ACCIDENT, HX OF 10/25/2007   CHEST PAIN 12/31/2007   COPD 10/25/2007   DEPRESSION 10/25/2007   FATIGUE 10/25/2007   GERD 10/25/2007   Headache(784.0) 10/25/2007   HEMIPARESIS, LEFT 11/23/2009   HYPERLIPIDEMIA 10/25/2007   HYPERTENSION 10/25/2007   INSOMNIA-SLEEP DISORDER-UNSPEC 10/25/2007   MITRAL INSUFFICIENCY 10/25/2007   OSTEOPENIA 11/19/2009   OSTEOPOROSIS 10/25/2007   PERIPHERAL VASCULAR DISEASE 10/25/2007   Symptomatic anemia    Unspecified visual loss 11/23/2009   VITAMIN D DEFICIENCY 06/03/2010   Past Surgical History:  Procedure Laterality Date   CHOLECYSTECTOMY     COLONOSCOPY WITH PROPOFOL N/A 09/27/2021   Procedure: COLONOSCOPY WITH PROPOFOL;  Surgeon: Midge Minium, MD;  Location: [facility] ENDOSCOPY;  Service: Endoscopy;  Laterality: N/A;   ESOPHAGOGASTRODUODENOSCOPY (EGD) WITH PROPOFOL N/A 09/27/2021   Procedure: ESOPHAGOGASTRODUODENOSCOPY (EGD) WITH PROPOFOL;  Surgeon: Midge Minium, MD;  Location: [facility] ENDOSCOPY;  Service: Endoscopy;  Laterality: N/A;   IR BONE MARROW BIOPSY & ASPIRATION  09/29/2021   TUBAL LIGATION      Current Outpatient Medications:    alendronate (FOSAMAX) 70 MG tablet, TAKE 1 TABLET BY MOUTH ONCE A WEEK WITH GLASS OF WATER ON AN EMPTY STOMACH (Patient taking differently: Take 70 mg by mouth every Tuesday.), Disp: 12 tablet, Rfl: 1   amLODipine (NORVASC) 10 MG tablet, Take 1 tablet (10 mg total) by mouth daily. (Patient not taking:  Reported on 10/15/2021), Disp: 90 tablet, Rfl: 3   aspirin EC 81 MG tablet, Take 81 mg by mouth in the morning., Disp: , Rfl:    Calcium Carbonate-Vit D-Min (CALCIUM 600+D3 PLUS MINERALS) 600-800 MG-UNIT TABS, Take 1 tablet by mouth daily., Disp: , Rfl:    hydrALAZINE (APRESOLINE) 100 MG tablet, Take 100 mg by mouth 2 (two) times daily., Disp: , Rfl:    lovastatin (MEVACOR) 20 MG tablet, 3 tabs by mouth per day (Patient taking differently: Take 20 mg by mouth daily with supper.), Disp: 270 tablet, Rfl: 3   melatonin 3 MG TABS tablet, Take 3 mg by mouth at bedtime., Disp: , Rfl:    mirtazapine (REMERON) 7.5 MG tablet, Take 7.5 mg by mouth at bedtime., Disp: , Rfl:    ondansetron (ZOFRAN-ODT) 4 MG disintegrating tablet, Take 4 mg by mouth every 8 (eight) hours as needed. (Patient not taking: Reported on 12/24/2021), Disp: , Rfl:    potassium chloride SA (KLOR-CON) 20 MEQ tablet, Take 20 mEq by mouth in the morning., Disp: , Rfl:    spironolactone (ALDACTONE) 25 MG tablet, Take 25 mg by mouth daily., Disp: , Rfl:    vitamin B-12 1000 MCG tablet, Take 1 tablet (1,000 mcg total) by mouth daily., Disp: 30 tablet, Rfl:   Allergies  Allergen Reactions   Latex Swelling    Lip swelled after having hair colored by person wearing latex gloves   Ace Inhibitors    Beta Adrenergic Blockers Other (See Comments)    Bradycardia   Clonidine Other (See Comments)  Fatigue    Escitalopram    Escitalopram Oxalate Nausea Only   Review of Systems Objective:  There were no vitals filed for this visit.  General: Well developed, nourished, in no acute distress, alert and oriented x3   Dermatological: Skin is warm, dry and supple bilateral. Nails x 10 are well maintained though they are thick yellow dystrophic discolored mycotic and painful.; remaining integument appears unremarkable at this time. There are no open sores, no preulcerative lesions, no rash or signs of infection present.  Vascular: Dorsalis Pedis  artery and Posterior Tibial artery pedal pulses are 2/4 bilateral with immedate capillary fill time. Pedal hair growth present. No varicosities and no lower extremity edema present bilateral.   Neruologic: Grossly intact via light touch bilateral. Vibratory intact via tuning fork bilateral. Protective threshold with Semmes Wienstein monofilament intact to all pedal sites bilateral.  Muscle strength is 4-5 dorsiflexion plantarflexion inverters everters  Musculoskeletal: No gross boney pedal deformities bilateral. No pain, crepitus, or limitation noted with foot and ankle range of motion bilateral. Muscular strength 5/5 in all groups tested bilateral.  Gait: Assisted by arm with antalgic gait   Radiographs:  None today  Assessment & Plan:   Assessment: Pain limb secondary onychomycosis  Plan: Debridement of toenails 1 through 5 bilateral     [name], [place]

## 2022-12-07 ENCOUNTER — Emergency Department: Payer: 59

## 2022-12-07 ENCOUNTER — Other Ambulatory Visit: Payer: Self-pay

## 2022-12-07 ENCOUNTER — Encounter: Payer: Self-pay | Admitting: Emergency Medicine

## 2022-12-07 ENCOUNTER — Emergency Department
Admission: EM | Admit: 2022-12-07 | Discharge: 2022-12-08 | Disposition: A | Discharge status: Home / Self Care | Payer: 59 | Attending: Emergency Medicine | Admitting: Emergency Medicine

## 2022-12-07 DIAGNOSIS — I129 Hypertensive chronic kidney disease with stage 1 through stage 4 chronic kidney disease, or unspecified chronic kidney disease: Secondary | ICD-10-CM | POA: Insufficient documentation

## 2022-12-07 DIAGNOSIS — I959 Hypotension, unspecified: Secondary | ICD-10-CM | POA: Insufficient documentation

## 2022-12-07 DIAGNOSIS — N189 Chronic kidney disease, unspecified: Secondary | ICD-10-CM | POA: Insufficient documentation

## 2022-12-07 DIAGNOSIS — X58XXXA Exposure to other specified factors, initial encounter: Secondary | ICD-10-CM | POA: Insufficient documentation

## 2022-12-07 DIAGNOSIS — J449 Chronic obstructive pulmonary disease, unspecified: Secondary | ICD-10-CM | POA: Diagnosis not present

## 2022-12-07 DIAGNOSIS — M79671 Pain in right foot: Secondary | ICD-10-CM | POA: Diagnosis present

## 2022-12-07 DIAGNOSIS — N179 Acute kidney failure, unspecified: Secondary | ICD-10-CM | POA: Insufficient documentation

## 2022-12-07 DIAGNOSIS — S9031XA Contusion of right foot, initial encounter: Secondary | ICD-10-CM | POA: Diagnosis not present

## 2022-12-07 LAB — BASIC METABOLIC PANEL
Anion gap: 10 (ref 5–15)
Anion gap: 9 (ref 5–15)
BUN: 31 mg/dL — ABNORMAL HIGH (ref 8–23)
BUN: 31 mg/dL — ABNORMAL HIGH (ref 8–23)
CO2: 16 mmol/L — ABNORMAL LOW (ref 22–32)
CO2: 18 mmol/L — ABNORMAL LOW (ref 22–32)
Calcium: 9 mg/dL (ref 8.9–10.3)
Calcium: 8.5 mg/dL — ABNORMAL LOW (ref 8.9–10.3)
Chloride: 104 mmol/L (ref 98–111)
Chloride: 107 mmol/L (ref 98–111)
Creatinine, Ser: 2.75 mg/dL — ABNORMAL HIGH (ref 0.44–1.00)
Creatinine, Ser: 2.67 mg/dL — ABNORMAL HIGH (ref 0.44–1.00)
GFR, Estimated: 18 mL/min — ABNORMAL LOW (ref >60)
GFR, Estimated: 19 mL/min — ABNORMAL LOW (ref >60)
Glucose, Bld: 98 mg/dL (ref 70–99)
Glucose, Bld: 102 mg/dL — ABNORMAL HIGH (ref 70–99)
Potassium: 4.5 mmol/L (ref 3.5–5.1)
Potassium: 3.9 mmol/L (ref 3.5–5.1)
Sodium: 130 mmol/L — ABNORMAL LOW (ref 135–145)
Sodium: 134 mmol/L — ABNORMAL LOW (ref 135–145)

## 2022-12-07 LAB — CBC WITH DIFFERENTIAL/PLATELET
Abs Immature Granulocytes: 0.03 10*3/uL (ref 0.00–0.07)
Basophils Absolute: 0 10*3/uL (ref 0.0–0.1)
Basophils Relative: 1 %
Eosinophils Absolute: 0.1 10*3/uL (ref 0.0–0.5)
Eosinophils Relative: 2 %
HCT: 31.3 % — ABNORMAL LOW (ref 36.0–46.0)
Hemoglobin: 9.6 g/dL — ABNORMAL LOW (ref 12.0–15.0)
Immature Granulocytes: 1 %
Lymphocytes Relative: 13 %
Lymphs Abs: 0.8 10*3/uL (ref 0.7–4.0)
MCH: 26.7 pg (ref 26.0–34.0)
MCHC: 30.7 g/dL (ref 30.0–36.0)
MCV: 86.9 fL (ref 80.0–100.0)
Monocytes Absolute: 0.4 10*3/uL (ref 0.1–1.0)
Monocytes Relative: 7 %
Neutro Abs: 4.8 10*3/uL (ref 1.7–7.7)
Neutrophils Relative %: 76 %
Platelets: 306 10*3/uL (ref 150–400)
RBC: 3.6 MIL/uL — ABNORMAL LOW (ref 3.87–5.11)
RDW: 14.1 % (ref 11.5–15.5)
WBC: 6.2 10*3/uL (ref 4.0–10.5)
nRBC: 0 % (ref 0.0–0.2)

## 2022-12-07 LAB — URINALYSIS, ROUTINE W REFLEX MICROSCOPIC
Bilirubin Urine: NEGATIVE
Glucose, UA: NEGATIVE mg/dL
Hgb urine dipstick: NEGATIVE
Ketones, ur: NEGATIVE mg/dL
Leukocytes,Ua: NEGATIVE
Nitrite: NEGATIVE
Protein, ur: NEGATIVE mg/dL
Specific Gravity, Urine: 1.011 (ref 1.005–1.030)
pH: 5 (ref 5.0–8.0)

## 2022-12-07 MED ORDER — SODIUM CHLORIDE 0.9 % IV BOLUS
1000.0000 mL | Freq: Once | INTRAVENOUS | Status: AC
  Administered 2022-12-07: 1000 mL via INTRAVENOUS

## 2022-12-07 NOTE — ED Triage Notes (Signed)
Patient to ED via [facility] from home for hypotension. Patient has hx of dementia and lives at home alone. Sent out by home health nurse for the low bp and right foot pain. Bruise noted on top of foot- pt states unsure of how it occurred. Aox0 at this time.

## 2022-12-07 NOTE — ED Provider Notes (Signed)
[facility] Provider Note    Event Date/Time   First MD Initiated Contact with Patient 12/07/22 1630     (approximate)   History   Hypotension   HPI  Yolanda [name] is a 67 y.o. female with history of CVA, VP shunt, cognitive impairment, hypertension, COPD, and CKD who presents with hypotension.  Per EMS, the patient's home health nurse noted her blood pressure was low and she had been complaining of right foot pain as well.  The patient herself is unsure of why she is here.  She states that her blood pressure is usually too high.  She denies any acute foot pain or any injury at this time.  She denies feeling dizzy or lightheaded.  The patient states that she is just somewhat sleepy although this is normal for her in the afternoon.  She states that since her most recent stroke she often will be awake more during the night and sleepy during the day.  I reviewed the past medical records.  The patient was most recently admitted in March of last year due to poor oral intake and syncope.  She was anemic and was transfused.  She also had AKI at that time.  Physical Exam   Triage Vital Signs: ED Triage Vitals [12/07/22 1408]  Enc Vitals Group     BP 115/78     Pulse Rate 68     Resp 18     Temp 98.2 F (36.8 C)     Temp Source Oral     SpO2 96 %     Weight      Height      Head Circumference      Peak Flow      Pain Score 0     Pain Loc      Pain Edu?      Excl. in GC?     Most recent vital signs: Vitals:   12/07/22 2000 12/07/22 2230  BP: (!) 140/83 117/61  Pulse: 75 97  Resp: 20 20  Temp: 98.2 F (36.8 C)   SpO2: 95% 95%     General: Alert, oriented x 2, comfortable appearing. CV:  Good peripheral perfusion.  Resp:  Normal effort.  Abd:  No distention.  Other:  EOMI.  PERRLA.  Motor intact in all extremities.  Slightly dry mucous membranes.  Bruising to dorsum of right foot.  No tenderness or deformity.  2+ DP pulse.  Normal cap refill.   Normal sensation and motor strength.   ED Results / Procedures / Treatments   Labs (all labs ordered are listed, but only abnormal results are displayed) Labs Reviewed  CBC WITH DIFFERENTIAL/PLATELET - Abnormal; Notable for the following components:      Result Value   RBC 3.60 (*)    Hemoglobin 9.6 (*)    HCT 31.3 (*)    All other components within normal limits  BASIC METABOLIC PANEL - Abnormal; Notable for the following components:   Sodium 130 (*)    CO2 16 (*)    BUN 31 (*)    Creatinine, Ser 2.75 (*)    GFR, Estimated 18 (*)    All other components within normal limits  URINALYSIS, ROUTINE W REFLEX MICROSCOPIC - Abnormal; Notable for the following components:   Color, Urine YELLOW (*)    APPearance HAZY (*)    All other components within normal limits  BASIC METABOLIC PANEL - Abnormal; Notable for the following components:   Sodium  134 (*)    CO2 18 (*)    Glucose, Bld 102 (*)    BUN 31 (*)    Creatinine, Ser 2.67 (*)    Calcium 8.5 (*)    GFR, Estimated 19 (*)    All other components within normal limits     EKG     RADIOLOGY  XR R foot: I independently reviewed and interpreted the images; there is no acute fracture   PROCEDURES:  Critical Care performed: No  Procedures   MEDICATIONS ORDERED IN ED: Medications  sodium chloride 0.9 % bolus 1,000 mL (0 mLs Intravenous Stopped 12/07/22 2305)     IMPRESSION / MDM / ASSESSMENT AND PLAN / ED COURSE  I reviewed the triage vital signs and the nursing notes.  67 year old female with PMH as noted above presents with apparent hypotension when she was at home although this has resolved now.  She denies any other acute symptoms.  She was also noted to have bruising to the right foot but does not have any pain there.  Physical exam is unremarkable for acute findings.  Initial lab workup reveals creatinine of 2.75 which is up from her baseline around 1.6.  Sodium is borderline low.  She is slightly  anemic.  Differential diagnosis includes, but is not limited to, dehydration/AKI, hypovolemia, UTI.  We will obtain a urinalysis, give a fluid bolus and recheck the BMP.  We will also obtain x-rays of the right foot although my suspicion for acute injury is low.  Patient's presentation is most consistent with acute complicated illness / injury requiring diagnostic workup.  The patient is on the cardiac monitor to evaluate for evidence of arrhythmia and/or significant heart rate changes.   ----------------------------------------- 12:05 AM on 12/08/2022 -----------------------------------------  Foot shows no acute findings and the patient is able to bear weight without difficulty.  I had an extensive discussion with the patient and with her daughter about the plan of care.  I consider whether the patient may benefit from admission for workup of AKI.  Based on shared decision making with the patient and her daughter, the patient expressed that she had a strong preference to go home if at all possible.  They agreed with the plan of fluids and repeat BMP.  After most of 1 L of fluids the creatinine and GFR improved just slightly.  I again discussed the plan.  I advised that I cannot rule out permanent kidney injury and that the patient may benefit from admission for workup, however if she strongly prefers to go home and pursue outpatient workup I think that this is reasonable, given that the creatinine has only increased by approximately 1.5x from baseline and she still has good urine output.  We also rechecked a urinalysis since the patient is just finishing treatment for UTI.  Is negative.  I gave the patient and her daughter strict return precautions and they expressed understanding.  They have PMD follow-up scheduled for 6/7 but I instructed him to call tomorrow to arrange for repeat labs sooner.  They understand they may return at any time if they change their minds and wish to pursue inpatient  workup.   FINAL CLINICAL IMPRESSION(S) / ED DIAGNOSES   Final diagnoses:  AKI (acute kidney injury) (HCC)     Rx / DC Orders   ED Discharge Orders     None        Note:  This document was prepared using Dragon voice recognition software and may include unintentional  dictation errors.    Dionne Bucy, MD 12/08/22 [phone]

## 2022-12-07 NOTE — ED Notes (Signed)
First Nurse Note: Pt to ED via [facility] from home for orthostatic hypotension. Pt is on BP medication. Pt has injury to right foot. Pt has hx/o dementia.

## 2022-12-07 NOTE — ED Notes (Signed)
Patient made aware of need for urine sample, feels they are unable to urinate at this time.

## 2022-12-08 NOTE — Discharge Instructions (Addendum)
The blood work today showed worsening in the kidney function with a creatinine of 2.75 and a GFR of 18.  This improved slightly with fluids.  This blood work should be rechecked by the primary care provider within the next 1 to 2 weeks.  Make sure to drink plenty of fluid.  In the meantime, return to the ER for difficulty urinating, burning or pain with urination, decreased amount of urine or not making any urine, weakness or lightheadedness, recurrent low blood pressure, or any other new or worsening symptoms that concern you.

## 2022-12-08 NOTE — ED Notes (Signed)
All discharge information reviewed with patient by MD. Patient and family at bedside verbalized understanding and denied further questions. Patient in wheelchair to ER exit by request. GCS15, NAD at time of discharge.

## 2022-12-23 ENCOUNTER — Emergency Department
Admission: EM | Admit: 2022-12-23 | Discharge: 2022-12-23 | Disposition: A | Discharge status: Home / Self Care | Payer: 59 | Attending: Emergency Medicine | Admitting: Emergency Medicine

## 2022-12-23 ENCOUNTER — Emergency Department: Payer: 59

## 2022-12-23 ENCOUNTER — Other Ambulatory Visit: Payer: Self-pay

## 2022-12-23 DIAGNOSIS — R600 Localized edema: Secondary | ICD-10-CM | POA: Diagnosis not present

## 2022-12-23 DIAGNOSIS — F0390 Unspecified dementia without behavioral disturbance: Secondary | ICD-10-CM | POA: Insufficient documentation

## 2022-12-23 DIAGNOSIS — R350 Frequency of micturition: Secondary | ICD-10-CM | POA: Diagnosis present

## 2022-12-23 DIAGNOSIS — J449 Chronic obstructive pulmonary disease, unspecified: Secondary | ICD-10-CM | POA: Diagnosis not present

## 2022-12-23 DIAGNOSIS — N390 Urinary tract infection, site not specified: Secondary | ICD-10-CM | POA: Insufficient documentation

## 2022-12-23 DIAGNOSIS — I10 Essential (primary) hypertension: Secondary | ICD-10-CM | POA: Insufficient documentation

## 2022-12-23 DIAGNOSIS — Z8673 Personal history of transient ischemic attack (TIA), and cerebral infarction without residual deficits: Secondary | ICD-10-CM | POA: Insufficient documentation

## 2022-12-23 LAB — BASIC METABOLIC PANEL
Anion gap: 11 (ref 5–15)
BUN: 28 mg/dL — ABNORMAL HIGH (ref 8–23)
CO2: 15 mmol/L — ABNORMAL LOW (ref 22–32)
Calcium: 9.1 mg/dL (ref 8.9–10.3)
Chloride: 108 mmol/L (ref 98–111)
Creatinine, Ser: 2.72 mg/dL — ABNORMAL HIGH (ref 0.44–1.00)
GFR, Estimated: 18 mL/min — ABNORMAL LOW (ref >60)
Glucose, Bld: 85 mg/dL (ref 70–99)
Potassium: 4.2 mmol/L (ref 3.5–5.1)
Sodium: 134 mmol/L — ABNORMAL LOW (ref 135–145)

## 2022-12-23 LAB — CBC
HCT: 32.5 % — ABNORMAL LOW (ref 36.0–46.0)
Hemoglobin: 9.5 g/dL — ABNORMAL LOW (ref 12.0–15.0)
MCH: 27.9 pg (ref 26.0–34.0)
MCHC: 29.2 g/dL — ABNORMAL LOW (ref 30.0–36.0)
MCV: 95.6 fL (ref 80.0–100.0)
Platelets: 230 10*3/uL (ref 150–400)
RBC: 3.4 MIL/uL — ABNORMAL LOW (ref 3.87–5.11)
RDW: 16.9 % — ABNORMAL HIGH (ref 11.5–15.5)
WBC: 6.3 10*3/uL (ref 4.0–10.5)
nRBC: 0 % (ref 0.0–0.2)

## 2022-12-23 LAB — URINALYSIS, ROUTINE W REFLEX MICROSCOPIC
Bilirubin Urine: NEGATIVE
Glucose, UA: NEGATIVE mg/dL
Hgb urine dipstick: NEGATIVE
Ketones, ur: NEGATIVE mg/dL
Nitrite: NEGATIVE
Protein, ur: NEGATIVE mg/dL
Specific Gravity, Urine: 1.004 — ABNORMAL LOW (ref 1.005–1.030)
pH: 5 (ref 5.0–8.0)

## 2022-12-23 MED ORDER — CEPHALEXIN 500 MG PO CAPS
500.0000 mg | ORAL_CAPSULE | Freq: Once | ORAL | Status: AC
  Administered 2022-12-23: 500 mg via ORAL
  Filled 2022-12-23: qty 1

## 2022-12-23 MED ORDER — CEPHALEXIN 500 MG PO CAPS: 500.0000 mg | ORAL_CAPSULE | Freq: Two times a day (BID) | ORAL | 0 refills | Status: AC

## 2022-12-23 NOTE — ED Notes (Signed)
Pt taken to the room via wheelchair and assisted to the restroom; Call bell within reach. primary RN made aware of the patients arrival.

## 2022-12-23 NOTE — ED Provider Notes (Addendum)
[facility] Provider Note    Event Date/Time   First MD Initiated Contact with Patient 12/23/22 1919     (approximate)  History   Chief Complaint: Urinary Frequency  HPI  Yolanda [name] is a 67 y.o. female with a past medical history of anxiety, COPD, gastric reflux, hypertension, hyperlipidemia, prior CVA, presents to the emergency department with concerns for urinary tract infection and possible blood clot in the leg.  According to report patient's daughter called 911 for the patient as she seems somewhat confused to her.  She does state the patient has a history of dementia but lives at home alone.  Here the patient is awake alert she has no complaints and is answering questions appropriately.  Physical Exam   Triage Vital Signs: ED Triage Vitals [12/23/22 1801]  Enc Vitals Group     BP (!) 222/173     Pulse Rate 69     Resp 18     Temp 98.4 F (36.9 C)     Temp Source Oral     SpO2 100 %     Weight 180 lb 12.4 oz (82 kg)     Height 5\' 7"  (1.702 m)     Head Circumference      Peak Flow      Pain Score 0     Pain Loc      Pain Edu?      Excl. in GC?     Most recent vital signs: Vitals:   12/23/22 1801  BP: (!) 222/173  Pulse: 69  Resp: 18  Temp: 98.4 F (36.9 C)  SpO2: 100%    General: Awake, no distress.  CV:  Good peripheral perfusion.  Regular rate and rhythm  Resp:  Normal effort.  Equal breath sounds bilaterally.  Abd:  No distention.  Soft, nontender.  No rebound or guarding. Other:  Patient has mild tenderness and edema of the left lower extremity mostly in the calf.   ED Results / Procedures / Treatments   RADIOLOGY  Ultrasound negative for DVT   MEDICATIONS ORDERED IN ED: Medications - No data to display   IMPRESSION / MDM / ASSESSMENT AND PLAN / ED COURSE  I reviewed the triage vital signs and the nursing notes.  Patient's presentation is most consistent with acute presentation with potential threat to life or  bodily function.  Patient presents emergency department from home with reports of mild confusion daughter was concerned the patient could possibly have a urinary tract infection.  Patient has a history of dementia per daughter currently is alert and oriented to person place she has no medical complaints.  Patient's physical exam shows left lower extremity swelling and mild tenderness to palpation over the calf.  We will obtain an ultrasound to further evaluate.  Patient is lab work shows a reassuring CBC, chronic kidney disease otherwise reassuring chemistry and urinalysis showing many bacteria.  We will dose oral antibiotics.  Patient blood pressure in triage significantly hypertensive 222/173 however in the room I rechecked the pressure myself and is currently 120/73.  Patient states she takes blood pressure medication but she has been taking it and has not missed any doses that she is aware of.  I have added on a urine culture.  Patient's ultrasound is negative for DVT.  Besides urinary tract infection the remainder of the patient's workup is reassuring.  We will place the patient on antibiotics.  We will discuss with the daughter for transportation home.  I spoke with the daughter who will be picking the patient up shortly.  FINAL CLINICAL IMPRESSION(S) / ED DIAGNOSES   Urinary tract infection  Rx / DC Orders   Keflex  Note:  This document was prepared using Dragon voice recognition software and may include unintentional dictation errors.   Minna Antis, MD 12/23/22 2100    Minna Antis, MD 12/23/22 2105

## 2022-12-23 NOTE — ED Triage Notes (Addendum)
Pt to ed from home via [facility] for possible uti. Daughter called 911. Pt is alert and oriented to self and location but not date and and time. Pt has HX of dementia and lives at home alone. Pt has no current complaints.  Pt has HX of HTN and takes meds but unknown name of her meds. Pt states she takes them daily.

## 2022-12-23 NOTE — ED Notes (Signed)
Pt ambulated to commode with assistance. Pt reports she uses a cane at home an didn't bring her cane with her. No other needs at present

## 2022-12-23 NOTE — ED Notes (Signed)
Pt's daughter Mrs. Sellars waiting for pt outside ER entrance

## 2022-12-23 NOTE — ED Triage Notes (Signed)
First Nurse: Pt here with a UTI and leg swelling and a possible blood clots. Pt has had multiple falls. Pt also has dementia.   120/80

## 2022-12-24 LAB — URINE CULTURE: Culture: >=100000 — AB

## 2022-12-26 LAB — URINE CULTURE
